# Patient Record
Sex: Male | Born: 1945 | Race: Black or African American | Hispanic: No | Marital: Single
Health system: Southern US, Community
[De-identification: ages and names within clinical notes are randomized; demographics above are authoritative.]

## PROBLEM LIST (undated history)

## (undated) DIAGNOSIS — I10 Essential (primary) hypertension: Secondary | ICD-10-CM

## (undated) DIAGNOSIS — E119 Type 2 diabetes mellitus without complications: Secondary | ICD-10-CM

## (undated) DIAGNOSIS — K219 Gastro-esophageal reflux disease without esophagitis: Secondary | ICD-10-CM

## (undated) DIAGNOSIS — K2210 Ulcer of esophagus without bleeding: Secondary | ICD-10-CM

## (undated) DIAGNOSIS — D509 Iron deficiency anemia, unspecified: Secondary | ICD-10-CM

## (undated) HISTORY — DX: Iron deficiency anemia, unspecified: D50.9

## (undated) HISTORY — PX: HERNIA REPAIR: SHX51

---

## 2009-12-22 ENCOUNTER — Emergency Department: Payer: Self-pay | Admitting: Emergency Medicine

## 2010-01-31 ENCOUNTER — Telehealth (INDEPENDENT_AMBULATORY_CARE_PROVIDER_SITE_OTHER): Payer: Self-pay | Admitting: Radiology

## 2010-02-01 ENCOUNTER — Encounter: Payer: Self-pay | Admitting: Internal Medicine

## 2010-02-01 ENCOUNTER — Ambulatory Visit: Payer: Self-pay | Admitting: Internal Medicine

## 2010-02-01 ENCOUNTER — Encounter (HOSPITAL_COMMUNITY)
Admission: RE | Admit: 2010-02-01 | Discharge: 2010-04-11 | Payer: Self-pay | Source: Home / Self Care | Attending: Internal Medicine | Admitting: Internal Medicine

## 2010-02-01 ENCOUNTER — Ambulatory Visit: Payer: Self-pay

## 2010-04-11 NOTE — Progress Notes (Signed)
Summary: nuc pre-procedure  Phone Note Outgoing Call   Call placed by: Domenic Polite, CNMT,  January 31, 2010 11:56 AM Call placed to: Patient Reason for Call: Confirm/change Appt Summary of Call: Tried to leave message several times, there was no answer. Initial call taken by: Domenic Polite, CNMT,  January 31, 2010 11:56 AM     Nuclear Med Background Indications for Stress Test: Evaluation for Ischemia     Symptoms: Chest Pain, SOB  Symptoms Comments: Indigestion   Nuclear Pre-Procedure Cardiac Risk Factors: Hypertension, Lipids, NIDDM

## 2010-04-11 NOTE — Assessment & Plan Note (Signed)
Summary: Cardiology Nuclear Testing  Nuclear Med Background Indications for Stress Test: Evaluation for Ischemia    History Comments: No documented CAD  Symptoms: Chest Pain  Symptoms Comments: Last episode of CP, "indigestion:one month ago.   Nuclear Pre-Procedure Cardiac Risk Factors: History of Smoking, Hypertension, Lipids, NIDDM, Obesity Caffeine/Decaff Intake: NONE NPO After: 7:30 AM Lungs: Clear.  O2 Sat 99% on RA. IV 0.9% NS with Angio Cath: 22g     IV Site: R Hand IV Started by: Doyne Keel, CNMT Chest Size (in) 46     Height (in): 73 Weight (lb): 248 BMI: 32.84  Nuclear Med Study 1 or 2 day study:  1 day     Stress Test Type:  Treadmill/Lexiscan Reading MD:  Arvilla Meres, MD     Referring MD:  Sherrie Mustache, MD Resting Radionuclide:  Technetium 64m Tetrofosmin     Resting Radionuclide Dose:  11 mCi  Stress Radionuclide:  Technetium 77m Tetrofosmin     Stress Radionuclide Dose:  33 mCi   Stress Protocol Exercise Time (min):  2:00 min     Max HR:  118 bpm     Predicted Max HR:  155 bpm  Max Systolic BP: 176 mm Hg     Percent Max HR:  76.13 %Rate Pressure Product:  16109  Lexiscan: 0.4 mg   Stress Test Technologist:  [employer], CMA-N     Nuclear Technologist:  Doyne Keel, CNMT  Rest Procedure  Myocardial perfusion imaging was performed at rest 45 minutes following the intravenous administration of Technetium 33m Tetrofosmin.  Stress Procedure  The patient received IV Lexiscan 0.4 mg over 15-seconds with concurrent low level exercise and then Technetium 72m Tetrofosmin was injected at 30-seconds.  There were no significant changes with infusion, only occasional PVC's and PAC's.  Quantitative spect images were obtained after a 45 minute delay.  QPS Raw Data Images:  Normal; no motion artifact; normal heart/lung ratio. 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  Transient Ischemic Dilatation:  0.28  (Normal <1.22)  Lung/Heart Ratio:  1.09  (Normal <0.45)  Quantitative Gated Spect Images QGS EDV:  141 ml QGS ESV:  61 ml QGS EF:  56 % QGS cine images:  Normal  Findings Abnormal nuclear study Evidence for inferior ischemia      Overall Impression  Exercise Capacity: Lexiscan with no exercise. ECG Impression: No significant ST segment change with Lexiscan. Overall Impression: Abnormal stress nuclear study. 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 (Cannot exclude diaphragmatic attenuation).  Appended Document: Cardiology Nuclear Testing copy faxed to dr. Marina Goodell

## 2013-05-10 DIAGNOSIS — K279 Peptic ulcer, site unspecified, unspecified as acute or chronic, without hemorrhage or perforation: Secondary | ICD-10-CM

## 2013-05-10 HISTORY — DX: Peptic ulcer, site unspecified, unspecified as acute or chronic, without hemorrhage or perforation: K27.9

## 2013-05-24 ENCOUNTER — Encounter (HOSPITAL_COMMUNITY): Payer: Self-pay | Admitting: Emergency Medicine

## 2013-05-24 ENCOUNTER — Emergency Department (HOSPITAL_COMMUNITY)
Admission: EM | Admit: 2013-05-24 | Discharge: 2013-05-24 | Discharge status: Against Medical Advice | Payer: Non-veteran care | Attending: Emergency Medicine | Admitting: Emergency Medicine

## 2013-05-24 ENCOUNTER — Observation Stay (HOSPITAL_COMMUNITY)
Admission: EM | Admit: 2013-05-24 | Discharge: 2013-05-26 | Discharge status: Against Medical Advice | Payer: Non-veteran care | Attending: Internal Medicine | Admitting: Internal Medicine

## 2013-05-24 ENCOUNTER — Emergency Department (HOSPITAL_COMMUNITY): Payer: Non-veteran care

## 2013-05-24 DIAGNOSIS — R0602 Shortness of breath: Secondary | ICD-10-CM | POA: Insufficient documentation

## 2013-05-24 DIAGNOSIS — R0989 Other specified symptoms and signs involving the circulatory and respiratory systems: Secondary | ICD-10-CM | POA: Insufficient documentation

## 2013-05-24 DIAGNOSIS — R079 Chest pain, unspecified: Secondary | ICD-10-CM | POA: Insufficient documentation

## 2013-05-24 DIAGNOSIS — E119 Type 2 diabetes mellitus without complications: Secondary | ICD-10-CM | POA: Diagnosis present

## 2013-05-24 DIAGNOSIS — I10 Essential (primary) hypertension: Secondary | ICD-10-CM | POA: Insufficient documentation

## 2013-05-24 DIAGNOSIS — R0609 Other forms of dyspnea: Secondary | ICD-10-CM | POA: Insufficient documentation

## 2013-05-24 DIAGNOSIS — R0600 Dyspnea, unspecified: Secondary | ICD-10-CM

## 2013-05-24 DIAGNOSIS — Z91199 Patient's noncompliance with other medical treatment and regimen due to unspecified reason: Secondary | ICD-10-CM | POA: Insufficient documentation

## 2013-05-24 DIAGNOSIS — Z8711 Personal history of peptic ulcer disease: Secondary | ICD-10-CM | POA: Insufficient documentation

## 2013-05-24 DIAGNOSIS — R5383 Other fatigue: Secondary | ICD-10-CM

## 2013-05-24 DIAGNOSIS — R110 Nausea: Secondary | ICD-10-CM | POA: Insufficient documentation

## 2013-05-24 DIAGNOSIS — K219 Gastro-esophageal reflux disease without esophagitis: Secondary | ICD-10-CM | POA: Insufficient documentation

## 2013-05-24 DIAGNOSIS — R5381 Other malaise: Secondary | ICD-10-CM | POA: Insufficient documentation

## 2013-05-24 DIAGNOSIS — R509 Fever, unspecified: Secondary | ICD-10-CM | POA: Insufficient documentation

## 2013-05-24 DIAGNOSIS — Z87891 Personal history of nicotine dependence: Secondary | ICD-10-CM | POA: Insufficient documentation

## 2013-05-24 DIAGNOSIS — Z23 Encounter for immunization: Secondary | ICD-10-CM | POA: Insufficient documentation

## 2013-05-24 DIAGNOSIS — R918 Other nonspecific abnormal finding of lung field: Secondary | ICD-10-CM | POA: Insufficient documentation

## 2013-05-24 DIAGNOSIS — E785 Hyperlipidemia, unspecified: Secondary | ICD-10-CM | POA: Diagnosis present

## 2013-05-24 DIAGNOSIS — Z9119 Patient's noncompliance with other medical treatment and regimen: Secondary | ICD-10-CM | POA: Insufficient documentation

## 2013-05-24 HISTORY — DX: Ulcer of esophagus without bleeding: K22.10

## 2013-05-24 HISTORY — DX: Gastro-esophageal reflux disease without esophagitis: K21.9

## 2013-05-24 HISTORY — DX: Type 2 diabetes mellitus without complications: E11.9

## 2013-05-24 HISTORY — DX: Essential (primary) hypertension: I10

## 2013-05-24 LAB — HEPATIC FUNCTION PANEL
ALT: 13 U/L (ref 0–53)
AST: 20 U/L (ref 0–37)
Albumin: 4.2 g/dL (ref 3.5–5.2)
Alkaline Phosphatase: 92 U/L (ref 39–117)
Total Bilirubin: 0.4 mg/dL (ref 0.3–1.2)
Total Protein: 8.5 g/dL — ABNORMAL HIGH (ref 6.0–8.3)

## 2013-05-24 LAB — CBC
HCT: 39.4 % (ref 39.0–52.0)
HEMOGLOBIN: 13.7 g/dL (ref 13.0–17.0)
MCH: 30 pg (ref 26.0–34.0)
MCHC: 34.8 g/dL (ref 30.0–36.0)
MCV: 86.4 fL (ref 78.0–100.0)
Platelets: 316 10*3/uL (ref 150–400)
RBC: 4.56 MIL/uL (ref 4.22–5.81)
RDW: 13.5 % (ref 11.5–15.5)
WBC: 10.4 10*3/uL (ref 4.0–10.5)

## 2013-05-24 LAB — BASIC METABOLIC PANEL
BUN: 16 mg/dL (ref 6–23)
CALCIUM: 9.8 mg/dL (ref 8.4–10.5)
CO2: 26 mEq/L (ref 19–32)
CREATININE: 1.11 mg/dL (ref 0.50–1.35)
Chloride: 92 mEq/L — ABNORMAL LOW (ref 96–112)
GFR calc Af Amer: 77 mL/min — ABNORMAL LOW (ref >90)
GFR, EST NON AFRICAN AMERICAN: 66 mL/min — AB (ref >90)
Glucose, Bld: 141 mg/dL — ABNORMAL HIGH (ref 70–99)
POTASSIUM: 4.4 meq/L (ref 3.7–5.3)
Sodium: 132 mEq/L — ABNORMAL LOW (ref 137–147)

## 2013-05-24 LAB — I-STAT TROPONIN, ED: Troponin i, poc: 0.01 ng/mL (ref 0.00–0.08)

## 2013-05-24 LAB — TROPONIN I: Troponin I: <0.3 ng/mL (ref <0.30)

## 2013-05-24 LAB — LIPASE, BLOOD: Lipase: 16 U/L (ref 11–59)

## 2013-05-24 LAB — D-DIMER, QUANTITATIVE: D-Dimer, Quant: 0.42 ug/mL-FEU (ref 0.00–0.48)

## 2013-05-24 LAB — PRO B NATRIURETIC PEPTIDE: PRO B NATRI PEPTIDE: 27.4 pg/mL (ref 0–125)

## 2013-05-24 MED ORDER — LISINOPRIL-HYDROCHLOROTHIAZIDE 20-12.5 MG PO TABS: 1.0000 | ORAL_TABLET | Freq: Every day | ORAL | Status: DC

## 2013-05-24 MED ORDER — MORPHINE SULFATE 4 MG/ML IJ SOLN
4.0000 mg | Freq: Once | INTRAMUSCULAR | Status: AC
  Administered 2013-05-24: 4 mg via INTRAVENOUS
  Filled 2013-05-24: qty 1

## 2013-05-24 MED ORDER — CARVEDILOL 6.25 MG PO TABS
6.2500 mg | ORAL_TABLET | Freq: Two times a day (BID) | ORAL | Status: DC
  Administered 2013-05-25 (×2): 6.25 mg via ORAL
  Filled 2013-05-24 (×4): qty 1

## 2013-05-24 MED ORDER — NITROGLYCERIN 0.4 MG SL SUBL
0.4000 mg | SUBLINGUAL_TABLET | SUBLINGUAL | Status: DC | PRN
  Administered 2013-05-24 (×2): 0.4 mg via SUBLINGUAL
  Filled 2013-05-24: qty 1

## 2013-05-24 MED ORDER — MORPHINE SULFATE 2 MG/ML IJ SOLN
2.0000 mg | INTRAMUSCULAR | Status: DC | PRN
Start: 2013-05-24 — End: 2013-05-26
  Administered 2013-05-25: 2 mg via INTRAVENOUS
  Filled 2013-05-24: qty 1

## 2013-05-24 MED ORDER — INSULIN ASPART 100 UNIT/ML ~~LOC~~ SOLN
0.0000 [IU] | Freq: Three times a day (TID) | SUBCUTANEOUS | Status: DC
  Administered 2013-05-25 (×3): 1 [IU] via SUBCUTANEOUS

## 2013-05-24 MED ORDER — SODIUM CHLORIDE 0.9 % IV SOLN
INTRAVENOUS | Status: DC
  Administered 2013-05-24: via INTRAVENOUS

## 2013-05-24 MED ORDER — ENOXAPARIN SODIUM 40 MG/0.4ML ~~LOC~~ SOLN
40.0000 mg | Freq: Every day | SUBCUTANEOUS | Status: DC
  Administered 2013-05-25: 40 mg via SUBCUTANEOUS
  Filled 2013-05-24 (×2): qty 0.4

## 2013-05-24 MED ORDER — ONDANSETRON HCL 4 MG/2ML IJ SOLN
4.0000 mg | Freq: Once | INTRAMUSCULAR | Status: DC
  Filled 2013-05-24: qty 2

## 2013-05-24 MED ORDER — BIOTENE DRY MOUTH MT LIQD
15.0000 mL | Freq: Two times a day (BID) | OROMUCOSAL | Status: DC
  Administered 2013-05-25 (×2): 15 mL via OROMUCOSAL

## 2013-05-24 MED ORDER — VITAMIN D3 25 MCG (1000 UNIT) PO TABS
2000.0000 [IU] | ORAL_TABLET | Freq: Every day | ORAL | Status: DC
  Administered 2013-05-25: 2000 [IU] via ORAL
  Filled 2013-05-24 (×2): qty 2

## 2013-05-24 MED ORDER — SODIUM CHLORIDE 0.9 % IV BOLUS (SEPSIS)
1000.0000 mL | Freq: Once | INTRAVENOUS | Status: AC
  Administered 2013-05-24: 1000 mL via INTRAVENOUS

## 2013-05-24 MED ORDER — NITROGLYCERIN 0.4 MG SL SUBL: 0.4000 mg | SUBLINGUAL_TABLET | SUBLINGUAL | Status: DC | PRN

## 2013-05-24 MED ORDER — SIMVASTATIN 20 MG PO TABS
20.0000 mg | ORAL_TABLET | Freq: Every day | ORAL | Status: DC
  Administered 2013-05-25 (×2): 20 mg via ORAL
  Filled 2013-05-24 (×4): qty 1

## 2013-05-24 MED ORDER — PANTOPRAZOLE SODIUM 40 MG PO TBEC
40.0000 mg | DELAYED_RELEASE_TABLET | Freq: Every day | ORAL | Status: DC
  Administered 2013-05-25: 40 mg via ORAL
  Filled 2013-05-24: qty 1

## 2013-05-24 MED ORDER — NITROGLYCERIN 0.4 MG/HR TD PT24
0.4000 mg | MEDICATED_PATCH | Freq: Every day | TRANSDERMAL | Status: DC
  Administered 2013-05-24 – 2013-05-25 (×2): 0.4 mg via TRANSDERMAL
  Filled 2013-05-24 (×2): qty 1

## 2013-05-24 MED ORDER — PNEUMOCOCCAL VAC POLYVALENT 25 MCG/0.5ML IJ INJ
0.5000 mL | INJECTION | INTRAMUSCULAR | Status: AC
  Administered 2013-05-25: 0.5 mL via INTRAMUSCULAR
  Filled 2013-05-24 (×2): qty 0.5

## 2013-05-24 MED ORDER — ASPIRIN 81 MG PO CHEW
324.0000 mg | CHEWABLE_TABLET | Freq: Once | ORAL | Status: AC
  Administered 2013-05-24: 324 mg via ORAL
  Filled 2013-05-24: qty 4

## 2013-05-24 MED ORDER — ASPIRIN EC 325 MG PO TBEC
325.0000 mg | DELAYED_RELEASE_TABLET | Freq: Every day | ORAL | Status: DC
  Administered 2013-05-25: 325 mg via ORAL
  Filled 2013-05-24 (×2): qty 1

## 2013-05-24 MED ORDER — LISINOPRIL 20 MG PO TABS
20.0000 mg | ORAL_TABLET | Freq: Every day | ORAL | Status: DC
  Administered 2013-05-25: 20 mg via ORAL
  Filled 2013-05-24: qty 1

## 2013-05-24 MED ORDER — MORPHINE SULFATE 4 MG/ML IJ SOLN
4.0000 mg | Freq: Once | INTRAMUSCULAR | Status: DC
  Filled 2013-05-24: qty 1

## 2013-05-24 MED ORDER — HYDROCHLOROTHIAZIDE 12.5 MG PO CAPS
12.5000 mg | ORAL_CAPSULE | Freq: Every day | ORAL | Status: DC
  Administered 2013-05-25: 12.5 mg via ORAL
  Filled 2013-05-24: qty 1

## 2013-05-24 NOTE — H&P (Addendum)
[facility] History and Physical  TZVI ECONOMOU ZOX:[id number] DOB: Jul 20, 1945 DOA: 05/24/2013  Referring physician: ER physician. PCP: No primary provider on file[facility] [place]  Chief Complaint: Chest pain.  HPI: Allen Wiggins is a 68 y.o. male history of diabetes mellitus type 2, hypertension, hyperlipidemia presents to the ER second time today with chest pain. Patient had chest pain since morning 3 AM while working. Patient's chest is retrosternal nonradiating pressure-like increased on exertion. He went home and had some water and since there was no relief he came to the ER. In the ER patient's cardiac markers were negative and chest pain improved with nitroglycerin sublingual and patient went home at the time and did not want to stay. After going chest pain recurred and came back to the ER. At this time patient's chest pain improved with morphine and sublingual nitroglycerin and has been admitted for further management. Patient says pain is retrosternal in this time is more like something gripping. In addition to the chest pain patient also has shortness of breath which makes patient difficult to lie down. Patient denies any fever chills cough but had diaphoresis in the morning and him initially. Chest x-ray shows atelectasis versus pneumonia and patient does not have any symptoms to suggest pneumonia. Patient otherwise denies any nausea vomiting abdominal pain diarrhea focal deficits headache.   Review of Systems: As presented in the history of presenting illness, rest negative.  Past Medical History  Diagnosis Date  . Diabetes mellitus without complication   . Hypertension   . GERD (gastroesophageal reflux disease)   . Peptic ulcer of esophagus    Past Surgical History  Procedure Laterality Date  . Hernia repair     Social History:  reports that he has quit smoking. He does not have any smokeless tobacco history on file. He reports that he drinks alcohol. His drug history  is not on file. Where does patient live home. Can patient participate in ADLs? Yes.  No Known Allergies  Family History:  Family History  Problem Relation Age of Onset  . Skin cancer Mother   . Diabetes Mellitus II Father   . Stroke Sister   . CAD Neg Hx       Prior to Admission medications   Medication Sig Start Date End Date Taking? Authorizing Provider  carvedilol (COREG) 12.5 MG tablet Take 6.25 mg by mouth 2 (two) times daily with a meal.   Yes Historical Provider, MD  cholecalciferol (VITAMIN D) 1000 UNITS tablet Take 2,000 Units by mouth daily.   Yes Historical Provider, MD  lisinopril-hydrochlorothiazide (PRINZIDE,ZESTORETIC) 20-12.5 MG per tablet Take 1 tablet by mouth daily.   Yes Historical Provider, MD  metFORMIN (GLUCOPHAGE) 500 MG tablet Take 500 mg by mouth 2 (two) times daily with a meal.   Yes Historical Provider, MD  nitroGLYCERIN (NITROSTAT) 0.4 MG SL tablet Place 1 tablet (0.4 mg total) under the tongue every 5 (five) minutes as needed for chest pain. 05/24/13  Yes Trixie Dredge, PA-C  pantoprazole (PROTONIX) 40 MG tablet Take 40 mg by mouth daily.   Yes Historical Provider, MD  pravastatin (PRAVACHOL) 40 MG tablet Take 40 mg by mouth at bedtime.   Yes Historical Provider, MD    Physical Exam: Filed Vitals:   05/24/13 2006 05/24/13 2015 05/24/13 2100 05/24/13 2144  BP: 111/70   116/79  Pulse:  73 69 69  Temp:    98.4 F (36.9 C)  TempSrc:    Oral  Resp:  15 18 20   SpO2:  97% 100% 100%     General:  Well-developed and nourished.  Eyes: Anicteric no pallor.  ENT: No discharge from the ears eyes nose mouth.  Neck: No mass felt.  Cardiovascular: S1-S2 heard.  Respiratory: No rhonchi or crepitations.  Abdomen: Soft nontender bowel sounds present.  Skin: No rash.  Musculoskeletal: No edema.  Psychiatric: Appears normal.  Neurologic: Alert awake oriented to time place and person. Moves all extremities.  Labs on Admission:  Basic Metabolic  Panel:  Recent Labs Lab 05/24/13 1036  NA 132*  K 4.4  CL 92*  CO2 26  GLUCOSE 141*  BUN 16  CREATININE 1.11  CALCIUM 9.8   Liver Function Tests:  Recent Labs Lab 05/24/13 1036  AST 20  ALT 13  ALKPHOS 92  BILITOT 0.4  PROT 8.5*  ALBUMIN 4.2    Recent Labs Lab 05/24/13 2015  LIPASE 16   No results found for this basename: AMMONIA,  in the last 168 hours CBC:  Recent Labs Lab 05/24/13 1036  WBC 10.4  HGB 13.7  HCT 39.4  MCV 86.4  PLT 316   Cardiac Enzymes:  Recent Labs Lab 05/24/13 2016  TROPONINI <0.30    BNP (last 3 results)  Recent Labs  05/24/13 1036  PROBNP 27.4   CBG: No results found for this basename: GLUCAP,  in the last 168 hours  Radiological Exams on Admission: Dg Chest Port 1 View  05/24/2013   CLINICAL DATA:  Chest pain and shortness of breath.  EXAM: PORTABLE CHEST - 1 VIEW  COMPARISON:  None.  FINDINGS: The heart size is exaggerated by low lung volumes and AP technique. Mild pulmonary vascular congestion is evident. Minimal left basilar airspace disease likely reflects atelectasis. Early infection is not excluded.  IMPRESSION: 1. Low lung volumes and mild pulmonary vascular congestion. 2. Left basilar airspace disease likely reflects atelectasis. Early infection is not excluded.   Electronically Signed   By: Gennette Pachris  Mattern M.D.   On: 05/24/2013 11:47    EKG: Independently reviewed. Normal sinus rhythm with T-wave changes in the anterior and inferior leads.  Assessment/Plan Principal Problem:   Chest pain Active Problems:   Diabetes mellitus   HTN (hypertension)   HLD (hyperlipidemia)   1. Chest pain - given patient's risk factors including diabetes hypertension hyperlipidemia and previous history of positive stress test in 2011 showing inferior wall ischemia patient has been admitted to rule out ACS and cycle cardiac markers and will be kept n.p.o. past 4 AM in anticipation of possible cardiac procedures. Check d-dimer and  lipase. Check 2-D echo. Consult cardiology in a.m. Aspirin. At this time place patient on nitroglycerin patch and as needed morphine. Continue Coreg and statins. 2. Diabetes mellitus type 2 - in anticipation of possible procedure I have held patient's metformin and place patient on sliding-scale coverage. 3. History of peptic ulcer disease and GERD - continue PPI. Patient states that previously he has had a procedure for gastric ulcer/esophagitis. 4. Hypertension - continue home medications. 5. Hyperlipidemia - continue statins.  I have reviewed patient's old charts and labs.  Code Status: Full code.  Family Communication: None.  Disposition Plan: Admit for observation.    Rishaan Gunner N. [facility] Pager [phone].  If 7PM-7AM, please contact night-coverage [email or web address] Password [email or web address] 05/24/2013, 9:49 PM

## 2013-05-24 NOTE — ED Provider Notes (Signed)
[facility] and reports he has been having chest pain off and on,but this morning about 5:30 am he started having severe chest tightness with SOB and feeling weak. His pain has been relieved in the ED with NTG x 2.. He states he has NTG but has never used it. He states the pain today was different and it was much worse.   Pt is alert and in NAD at this time (painfree), he is not in respiratory distress.   Pt is adamant that he does not want to be admitted. He understands the risks of MI and even death. [facility].   Medical screening examination/treatment/procedure(s) were conducted as a shared visit with non-physician practitioner(s) and myself.  I personally evaluated the patient during the encounter.   EKG Interpretation   Date/Time:  Sunday May 24 2013 10:30:27 EDT Ventricular Rate:  65 PR Interval:  188 QRS Duration: 92 QT Interval:  389 QTC Calculation: 404 R Axis:   105 Text Interpretation:  Sinus rhythm Right axis deviation Low voltage,  precordial leads Consider anterior infarct borderlin T wave abnormalities  No significant change since last tracing Confirmed by Jalyiah Shelley  MD-I, Inmer Nix  ([id number]) on 05/24/2013 12:21:37 PM        Devoria AlbeIva Parrie Rasco, MD, Armando GangFACEP   Ward GivensIva L Dade Rodin, MD 05/24/13 1640

## 2013-05-24 NOTE — ED Notes (Addendum)
Pt reports chest pain started this morning at 0530. Reports SOB, and "wants to belch but I can't." Patient A&Ox4 and sitting in chair. Pt refused to remain in bed. Safely transferred to chair with this nurse remaining with pt. Pt able to ambulate independently.

## 2013-05-24 NOTE — Discharge Instructions (Signed)
We have strongly advised that you stay and be admitted to the hospital.  If you change your mind at any time, please return immediately to the hospital.  If you have worsening pain or recurrent symptoms, please go to your nearest emergency department.  Please follow up with your primary care provider as soon as possible.      Chest Pain (Nonspecific) It is often hard to give a specific diagnosis for the cause of chest pain. There is always a chance that your pain could be related to something serious, such as a heart attack or a blood clot in the lungs. You need to follow up with your caregiver for further evaluation. CAUSES   Heartburn.  Pneumonia or bronchitis.  Anxiety or stress.  Inflammation around your heart (pericarditis) or lung (pleuritis or pleurisy).  A blood clot in the lung.  A collapsed lung (pneumothorax). It can develop suddenly on its own (spontaneous pneumothorax) or from injury (trauma) to the chest.  Shingles infection (herpes zoster virus). The chest wall is composed of bones, muscles, and cartilage. Any of these can be the source of the pain.  The bones can be bruised by injury.  The muscles or cartilage can be strained by coughing or overwork.  The cartilage can be affected by inflammation and become sore (costochondritis). DIAGNOSIS  Lab tests or other studies, such as X-rays, electrocardiography, stress testing, or cardiac imaging, may be needed to find the cause of your pain.  TREATMENT   Treatment depends on what may be causing your chest pain. Treatment may include:  Acid blockers for heartburn.  Anti-inflammatory medicine.  Pain medicine for inflammatory conditions.  Antibiotics if an infection is present.  You may be advised to change lifestyle habits. This includes stopping smoking and avoiding alcohol, caffeine, and chocolate.  You may be advised to keep your head raised (elevated) when sleeping. This reduces the chance of acid going  backward from your stomach into your esophagus.  Most of the time, nonspecific chest pain will improve within 2 to 3 days with rest and mild pain medicine. HOME CARE INSTRUCTIONS   If antibiotics were prescribed, take your antibiotics as directed. Finish them even if you start to feel better.  For the next few days, avoid physical activities that bring on chest pain. Continue physical activities as directed.  Do not smoke.  Avoid drinking alcohol.  Only take over-the-counter or prescription medicine for pain, discomfort, or fever as directed by your caregiver.  Follow your caregiver's suggestions for further testing if your chest pain does not go away.  Keep any follow-up appointments you made. If you do not go to an appointment, you could develop lasting (chronic) problems with pain. If there is any problem keeping an appointment, you must call to reschedule. SEEK MEDICAL CARE IF:   You think you are having problems from the medicine you are taking. Read your medicine instructions carefully.  Your chest pain does not go away, even after treatment.  You develop a rash with blisters on your chest. SEEK IMMEDIATE MEDICAL CARE IF:   You have increased chest pain or pain that spreads to your arm, neck, jaw, back, or abdomen.  You develop shortness of breath, an increasing cough, or you are coughing up blood.  You have severe back or abdominal pain, feel nauseous, or vomit.  You develop severe weakness, fainting, or chills.  You have a fever. THIS IS AN EMERGENCY. Do not wait to see if the pain will go away.  Get medical help at once. Call your local emergency services (911 in [place] Do not drive yourself to the hospital. MAKE SURE YOU:   Understand these instructions.  Will watch your condition.  Will get help right away if you are not doing well or get worse. Document Released: 12/06/2004 Document Revised: 05/21/2011 Document Reviewed: 10/02/2007 [facility] Patient Information  2014 [place], [place].

## 2013-05-24 NOTE — ED Notes (Signed)
He states he felt better after having been here earlier in the day after receiving s.l. Nitroglycerine.  He states he felt well enough to leave earlier, so he did so.  He is here now with c/o recurrence of his central chest area pain, along with shortness of breath and exertional dyspnea.  Currently his skin is normal, warm and dry and he is breathing normally.

## 2013-05-24 NOTE — ED Provider Notes (Signed)
CSN: [id number]     Arrival date & time 05/24/13  1191 History   First MD Initiated Contact with Patient 05/24/13 1015     Chief Complaint  Patient presents with  . Chest Pain     (Consider location/radiation/quality/duration/timing/severity/associated sxs/prior Treatment) The history is provided by the patient.   Pt with hx HTN, hyperlipidemia, DM, abnormal lexiscan showing inferior wall ischemia in 2011, p/w central chest pain, constant since 5:30am, described as squeezing and sharp and sore, associated SOB, nausea, and left arm pain/weakness.  Pain is somewhat improved with leaning forward.  He ate a honey bun and pie this morning prior to the pain beginning, thought it might be indigestion.  He drank wine and water at home for pain but it did not help.   No recent illness, no fevers.  Denies recent immobilization, hx cancer, personal or family hx blood clots.  PCP is Dr Marina Goodell in [facility].    Past Medical History  Diagnosis Date  . Diabetes mellitus without complication    History reviewed. No pertinent past surgical history. No family history on file. History  Substance Use Topics  . Smoking status: Former Games developer  . Smokeless tobacco: Not on file  . Alcohol Use: Yes     Comment: ocassional    Review of Systems  Constitutional: Negative for fever.  Respiratory: Positive for shortness of breath. Negative for cough.   Cardiovascular: Positive for chest pain.  Gastrointestinal: Negative for nausea, vomiting, abdominal pain and diarrhea.  Genitourinary: Negative for dysuria, urgency and frequency.  Neurological: Positive for weakness.  All other systems reviewed and are negative.      Allergies  Review of patient's allergies indicates no known allergies.  Home Medications  No current outpatient prescriptions on file. BP 130/79  Temp(Src) 98.7 F (37.1 C) (Oral)  Resp 22  SpO2 100% Physical Exam  Nursing note and vitals  reviewed. Constitutional: He appears well-developed and well-nourished. No distress.  Uncomfortable appearing.  Standing, leaning forward over bed rail when I came to room.    HENT:  Head: Normocephalic and atraumatic.  Neck: Neck supple.  Cardiovascular: Normal rate, regular rhythm and intact distal pulses.   Pulmonary/Chest: Effort normal and breath sounds normal. No respiratory distress. He has no wheezes. He has no rales. He exhibits tenderness.    Abdominal: Soft. He exhibits no distension and no mass. There is no tenderness. There is no rebound and no guarding.  Musculoskeletal: He exhibits no edema and no tenderness.  Neurological: He is alert. He exhibits normal muscle tone.  Skin: He is not diaphoretic.    ED Course  Procedures (including critical care time) Labs Review Labs Reviewed  BASIC METABOLIC PANEL - Abnormal; Notable for the following:    Sodium 132 (*)    Chloride 92 (*)    Glucose, Bld 141 (*)    GFR calc non Af Amer 66 (*)    GFR calc Af Amer 77 (*)    All other components within normal limits  HEPATIC FUNCTION PANEL - Abnormal; Notable for the following:    Total Protein 8.5 (*)    All other components within normal limits  CBC  PRO B NATRIURETIC PEPTIDE  I-STAT TROPOININ, ED   Imaging Review Dg Chest Port 1 View  05/24/2013   CLINICAL DATA:  Chest pain and shortness of breath.  EXAM: PORTABLE CHEST - 1 VIEW  COMPARISON:  None.  FINDINGS: The heart size is exaggerated by low lung volumes and  AP technique. Mild pulmonary vascular congestion is evident. Minimal left basilar airspace disease likely reflects atelectasis. Early infection is not excluded.  IMPRESSION: 1. Low lung volumes and mild pulmonary vascular congestion. 2. Left basilar airspace disease likely reflects atelectasis. Early infection is not excluded.   Electronically Signed   By: Gennette Pac M.D.   On: 05/24/2013 11:47     EKG Interpretation   Date/Time:  Sunday May 24 2013 10:30:27  EDT Ventricular Rate:  65 PR Interval:  188 QRS Duration: 92 QT Interval:  389 QTC Calculation: 404 R Axis:   105 Text Interpretation:  Sinus rhythm Right axis deviation Low voltage,  precordial leads Consider anterior infarct borderlin T wave abnormalities  No significant change since last tracing Confirmed by KNAPP  MD-I, IVA  ([id number]) on 05/24/2013 12:21:37 PM     10 :30 AM Dr Lynelle Doctor made aware of patient.    11:17 AM Patient reports some improvement in SOB with nitroglycerin, BP is steady, have asked nurse to continue with second dose and add morphine, closely monitor BP  12:12 PM Patient reports he is feeling better.  I have discussed results with him thus far and my concern that with his abnormal EKG and hx abnormal lexiscan and current presentation that he might be having an MI/ "heart attack."  Pt acknowledges that he was told about abnormal cardiac study in 2011.  I have informed patient that I am concerned about his symptoms this morning and that I am specifically concerned about his heart and that I would like to admit him to the hospital for observation to continue to monitor and test his heart.  I have explained that if he chooses to leave he may die or be permanently disabled.  He acknowledges this and yet is eager to leave.  He tells me that he understands he may be having a heart attack but prefers that his primary care provider take care of him for this and do further testing.  I have expressed my concern that it is Sunday and the timing of seeing his primary care provider is not ideal if he is truly having an MI, that it is something that needs to be addressed now.  He states that he has had pain like this before, though admits never this severe, and he refuses to stay.  He is alert, conscious, conversational, appears to fully grasp and acknowledges my concerns for his health.  I have asked if there is something we can do to make him more comfortable or any concerns we might address  for him that might make him more comfortable and willing to stay - he says no.    Filed Vitals:   05/24/13 1200  BP: 111/74  Pulse:   Temp:   Resp: 18     MDM   Final diagnoses:  Chest pain  Dyspnea    Pt with hx HTN, hyperlipidemia, DM p/w central CP with rads to left arm, associated SOB, nausea.  Concern for ACS.  Pt has T wave flattening laterally.  Pain improved with nitroglycerin.  First troponin is negative.  CXR shows left basilar airspace disease vs atelectasis.  I suspect this is atelectasis and not pneumonia as patient has not had a cough or fever.  If patient agreed to admission he could be monitored and further worked up as needed, especially for possible PE if abnormality remained and patient remained symptomatic.  He does not have known risk factors for PE so this is less of  a concern than cardiac causes for his symptoms.  However, patient declines admission.  He has been made aware of our concerns, was also seen by Dr Lynelle DoctorKnapp.  I encouraged him to the best of my abilities to stay but he is capable of making his own medical decisions, he is clinically sober, alert, conversant, and expresses understanding of my concerns.  See discussion above.  Pt left against medical advice.  I have encouraged him to return at any time if he changes his mind and to follow up as soon as possible with his primary care provider if this is the only route he will accept.  Pt given a prescription for nitroglycerin per Dr Delford FieldKnapp's recommendation.      Trixie Dredgemily Damarco Keysor, PA-C 05/24/13 1556

## 2013-05-24 NOTE — ED Notes (Signed)
Pt signed AMA form. Was explained to pt that EDP believes that he is at risk for MI and leaving could be fatal and not recommended. Pt verbalized that he understood, he "is not staying" and stated,"Everyone has to die sometime." Pt was instructed to follow up ASAP with PCP and to return to ED/Call EMS if he experienced any CP, SOB, N/V, dizziness, or any new s/sx that were unusual. Pt agreed. Pt was instructed on taking nitro and when to call EMS. Pt is A&O and in NAD, VSS upon d/c. Pt walked to d/c window with no difficulty.

## 2013-05-24 NOTE — ED Notes (Addendum)
Pt c/o chest pain since 0530 this morning. States he usually has indigestion and it goes away but this has not. States he feel like he has tightness in his esophagus as well. Also states he has a hard time taking a deep breathe, denies dizziness,  blurred vision. States his left arm feels weak

## 2013-05-24 NOTE — ED Provider Notes (Signed)
See prior note   Ward GivensIva L Vasco Chong, MD 05/24/13 [phone]

## 2013-05-24 NOTE — ED Notes (Signed)
Attempted to give pt another nitro while pain had not subsided. Pt refused and wanted to wait. Pain began to subside w/out second nitro.

## 2013-05-24 NOTE — ED Provider Notes (Signed)
CSN: [id number]     Arrival date & time 05/24/13  1851 History   First MD Initiated Contact with Patient 05/24/13 1914     Chief Complaint  Patient presents with  . Chest Pain     (Consider location/radiation/quality/duration/timing/severity/associated sxs/prior Treatment) HPI Comments: 68 year old male presents with chest pain since early this morning. He states at 5:30 AM (14 hours ago) he had severe shortness of breath and chest pain with exertion. The pain seems to be a little bit better at rest. It is also worse with lying flat and better with sitting up. Does not seem to be a pleuritic component. He received nitroglycerin x2 in the ER but as he felt better he wanted to leave. The ER team want to admit him and he left AMA. The patient went home and now felt worse so he returned to the ER to have more treatment. Of note he did have an abnormal stress test in 2011 per our records. At this time the pain is similar to when he first arrived earlier this morning.   Past Medical History  Diagnosis Date  . Diabetes mellitus without complication   . Hypertension   . GERD (gastroesophageal reflux disease)   . Peptic ulcer of esophagus    No past surgical history on file. No family history on file. History  Substance Use Topics  . Smoking status: Former Games developer  . Smokeless tobacco: Not on file  . Alcohol Use: Yes     Comment: ocassional    Review of Systems  Respiratory: Positive for shortness of breath.   Cardiovascular: Positive for chest pain. Negative for leg swelling.  Gastrointestinal: Negative for vomiting and abdominal pain.  All other systems reviewed and are negative.      Allergies  Review of patient's allergies indicates no known allergies.  Home Medications   Current Outpatient Rx  Name  Route  Sig  Dispense  Refill  . carvedilol (COREG) 12.5 MG tablet   Oral   Take 6.25 mg by mouth 2 (two) times daily with a meal.         . cholecalciferol (VITAMIN D) 1000  UNITS tablet   Oral   Take 2,000 Units by mouth daily.         Marland Kitchen lisinopril-hydrochlorothiazide (PRINZIDE,ZESTORETIC) 20-12.5 MG per tablet   Oral   Take 1 tablet by mouth daily.         . metFORMIN (GLUCOPHAGE) 500 MG tablet   Oral   Take 500 mg by mouth 2 (two) times daily with a meal.         . nitroGLYCERIN (NITROSTAT) 0.4 MG SL tablet   Sublingual   Place 1 tablet (0.4 mg total) under the tongue every 5 (five) minutes as needed for chest pain.   20 tablet   0   . pantoprazole (PROTONIX) 40 MG tablet   Oral   Take 40 mg by mouth daily.         . pravastatin (PRAVACHOL) 40 MG tablet   Oral   Take 40 mg by mouth at bedtime.          BP 127/78  Pulse 80  Temp(Src) 98.3 F (36.8 C) (Oral)  Resp 18  SpO2 98% Physical Exam  Nursing note and vitals reviewed. Constitutional: He is oriented to person, place, and time. He appears well-developed and well-nourished.  HENT:  Head: Normocephalic and atraumatic.  Right Ear: External ear normal.  Left Ear: External ear normal.  Nose: Nose normal.  Eyes: Right eye exhibits no discharge. Left eye exhibits no discharge.  Neck: Neck supple.  Cardiovascular: Normal rate, regular rhythm, normal heart sounds and intact distal pulses.   Pulmonary/Chest: Effort normal and breath sounds normal.  Abdominal: Soft. There is no tenderness.  Musculoskeletal: He exhibits no edema.  Neurological: He is alert and oriented to person, place, and time.  Skin: Skin is warm and dry.    ED Course  Procedures (including critical care time) Labs Review Labs Reviewed  TROPONIN I   Imaging Review Dg Chest Port 1 View  05/24/2013   CLINICAL DATA:  Chest pain and shortness of breath.  EXAM: PORTABLE CHEST - 1 VIEW  COMPARISON:  None.  FINDINGS: The heart size is exaggerated by low lung volumes and AP technique. Mild pulmonary vascular congestion is evident. Minimal left basilar airspace disease likely reflects atelectasis. Early infection  is not excluded.  IMPRESSION: 1. Low lung volumes and mild pulmonary vascular congestion. 2. Left basilar airspace disease likely reflects atelectasis. Early infection is not excluded.   Electronically Signed   By: Gennette Pachris  Mattern M.D.   On: 05/24/2013 11:47     EKG Interpretation   Date/Time:  Sunday May 24 2013 19:02:39 EDT Ventricular Rate:  76 PR Interval:  137 QRS Duration: 91 QT Interval:  372 QTC Calculation: 418 R Axis:   -45 Text Interpretation:  Sinus rhythm Left anterior fascicular block Low  voltage, precordial leads nonspecific T wave abnormalities Limb leads QRS  are all reversed, ?lead placement Reconfirmed by John Vasconcelos  MD, Sheyenne Konz  ([id number]) on 05/24/2013 7:39:53 PM      MDM   Final diagnoses:  Chest pain    Patient's pain improved with nitro and morphine. There is no pleuritic component to his pain, doubt PE. EKG shows some T wave abnormalities but also shows flipped leads in limb leads. Our leads appear correct here. Do not appear as Q waves, I favor the leads were placed differently than this morning. Will treat his pain and admit for further cardiac workup to hospitalist. Negative troponin.    Audree CamelScott T Rey Fors, MD 05/24/13 2130

## 2013-05-24 NOTE — ED Notes (Addendum)
Pt went from 10/10 to 3/10 with SL nitro. Pt refused morphine and zofran and requested to have another nitro. Pt BP an HR WNL to admin. Pt is A&O and in NAD. Lars MageI Knapp notified

## 2013-05-25 ENCOUNTER — Encounter (HOSPITAL_COMMUNITY): Admission: EM | Discharge status: Against Medical Advice | Payer: Self-pay | Source: Home / Self Care

## 2013-05-25 DIAGNOSIS — I2510 Atherosclerotic heart disease of native coronary artery without angina pectoris: Secondary | ICD-10-CM

## 2013-05-25 DIAGNOSIS — I517 Cardiomegaly: Secondary | ICD-10-CM

## 2013-05-25 HISTORY — PX: LEFT HEART CATHETERIZATION WITH CORONARY ANGIOGRAM: SHX5451

## 2013-05-25 LAB — GLUCOSE, CAPILLARY
GLUCOSE-CAPILLARY: 134 mg/dL — AB (ref 70–99)
Glucose-Capillary: 145 mg/dL — ABNORMAL HIGH (ref 70–99)
Glucose-Capillary: 123 mg/dL — ABNORMAL HIGH (ref 70–99)
Glucose-Capillary: 132 mg/dL — ABNORMAL HIGH (ref 70–99)

## 2013-05-25 LAB — CBC WITH DIFFERENTIAL/PLATELET
Basophils Absolute: 0 10*3/uL (ref 0.0–0.1)
Basophils Relative: 0 % (ref 0–1)
EOS PCT: 1 % (ref 0–5)
Eosinophils Absolute: 0.1 10*3/uL (ref 0.0–0.7)
HCT: 36.5 % — ABNORMAL LOW (ref 39.0–52.0)
HEMOGLOBIN: 12 g/dL — AB (ref 13.0–17.0)
LYMPHS ABS: 2.9 10*3/uL (ref 0.7–4.0)
Lymphocytes Relative: 37 % (ref 12–46)
MCH: 28.8 pg (ref 26.0–34.0)
MCHC: 32.9 g/dL (ref 30.0–36.0)
MCV: 87.5 fL (ref 78.0–100.0)
MONOS PCT: 10 % (ref 3–12)
Monocytes Absolute: 0.8 10*3/uL (ref 0.1–1.0)
Neutro Abs: 4.1 10*3/uL (ref 1.7–7.7)
Neutrophils Relative %: 52 % (ref 43–77)
Platelets: 271 10*3/uL (ref 150–400)
RBC: 4.17 MIL/uL — AB (ref 4.22–5.81)
RDW: 13.6 % (ref 11.5–15.5)
WBC: 7.8 10*3/uL (ref 4.0–10.5)

## 2013-05-25 LAB — CBC
HEMATOCRIT: 34.5 % — AB (ref 39.0–52.0)
Hemoglobin: 11.9 g/dL — ABNORMAL LOW (ref 13.0–17.0)
MCH: 30 pg (ref 26.0–34.0)
MCHC: 34.5 g/dL (ref 30.0–36.0)
MCV: 86.9 fL (ref 78.0–100.0)
Platelets: 253 10*3/uL (ref 150–400)
RBC: 3.97 MIL/uL — ABNORMAL LOW (ref 4.22–5.81)
RDW: 13.5 % (ref 11.5–15.5)
WBC: 8.9 10*3/uL (ref 4.0–10.5)

## 2013-05-25 LAB — COMPREHENSIVE METABOLIC PANEL
ALT: 9 U/L (ref 0–53)
AST: 15 U/L (ref 0–37)
Albumin: 3.4 g/dL — ABNORMAL LOW (ref 3.5–5.2)
Alkaline Phosphatase: 76 U/L (ref 39–117)
BILIRUBIN TOTAL: 0.9 mg/dL (ref 0.3–1.2)
BUN: 18 mg/dL (ref 6–23)
CALCIUM: 8.9 mg/dL (ref 8.4–10.5)
CHLORIDE: 97 meq/L (ref 96–112)
CO2: 25 meq/L (ref 19–32)
Creatinine, Ser: 1.15 mg/dL (ref 0.50–1.35)
GFR calc Af Amer: 74 mL/min — ABNORMAL LOW (ref >90)
GFR calc non Af Amer: 64 mL/min — ABNORMAL LOW (ref >90)
Glucose, Bld: 152 mg/dL — ABNORMAL HIGH (ref 70–99)
Potassium: 3.9 mEq/L (ref 3.7–5.3)
Sodium: 135 mEq/L — ABNORMAL LOW (ref 137–147)
Total Protein: 7.2 g/dL (ref 6.0–8.3)

## 2013-05-25 LAB — SURGICAL PCR SCREEN
MRSA, PCR: NEGATIVE
STAPHYLOCOCCUS AUREUS: NEGATIVE

## 2013-05-25 LAB — CREATININE, SERUM
CREATININE: 1.17 mg/dL (ref 0.50–1.35)
GFR, EST AFRICAN AMERICAN: 72 mL/min — AB (ref >90)
GFR, EST NON AFRICAN AMERICAN: 62 mL/min — AB (ref >90)

## 2013-05-25 LAB — TROPONIN I

## 2013-05-25 SURGERY — LEFT HEART CATHETERIZATION WITH CORONARY ANGIOGRAM
Anesthesia: LOCAL

## 2013-05-25 MED ORDER — SODIUM CHLORIDE 0.9 % IJ SOLN: 3.0000 mL | INTRAMUSCULAR | Status: DC | PRN

## 2013-05-25 MED ORDER — SODIUM CHLORIDE 0.9 % IV SOLN
1.0000 mL/kg/h | INTRAVENOUS | Status: AC
  Administered 2013-05-25: 1 mL/kg/h via INTRAVENOUS

## 2013-05-25 MED ORDER — SODIUM CHLORIDE 0.9 % IV SOLN: 250.0000 mL | INTRAVENOUS | Status: DC | PRN

## 2013-05-25 MED ORDER — VERAPAMIL HCL 2.5 MG/ML IV SOLN
INTRAVENOUS | Status: AC
  Filled 2013-05-25: qty 2

## 2013-05-25 MED ORDER — FENTANYL CITRATE 0.05 MG/ML IJ SOLN
INTRAMUSCULAR | Status: AC
  Filled 2013-05-25: qty 2

## 2013-05-25 MED ORDER — SODIUM CHLORIDE 0.9 % IJ SOLN: 3.0000 mL | Freq: Two times a day (BID) | INTRAMUSCULAR | Status: DC

## 2013-05-25 MED ORDER — SODIUM CHLORIDE 0.9 % IV SOLN: INTRAVENOUS | Status: DC

## 2013-05-25 MED ORDER — CARVEDILOL 3.125 MG PO TABS
3.1250 mg | ORAL_TABLET | Freq: Two times a day (BID) | ORAL | Status: DC
  Administered 2013-05-25: 20:00:00 3.125 mg via ORAL
  Filled 2013-05-25 (×3): qty 1

## 2013-05-25 MED ORDER — HEPARIN (PORCINE) IN NACL 2-0.9 UNIT/ML-% IJ SOLN
INTRAMUSCULAR | Status: AC
  Filled 2013-05-25: qty 1000

## 2013-05-25 MED ORDER — LIDOCAINE HCL (PF) 1 % IJ SOLN
INTRAMUSCULAR | Status: AC
  Filled 2013-05-25: qty 30

## 2013-05-25 MED ORDER — ASPIRIN 81 MG PO CHEW
81.0000 mg | CHEWABLE_TABLET | ORAL | Status: DC
  Filled 2013-05-25: qty 1

## 2013-05-25 MED ORDER — HEPARIN SODIUM (PORCINE) 1000 UNIT/ML IJ SOLN
INTRAMUSCULAR | Status: AC
  Filled 2013-05-25: qty 1

## 2013-05-25 MED ORDER — HEPARIN SODIUM (PORCINE) 5000 UNIT/ML IJ SOLN
5000.0000 [IU] | Freq: Three times a day (TID) | INTRAMUSCULAR | Status: DC
  Administered 2013-05-25: 21:00:00 5000 [IU] via SUBCUTANEOUS
  Filled 2013-05-25 (×5): qty 1

## 2013-05-25 MED ORDER — LIVING WELL WITH DIABETES BOOK
Freq: Once | Status: DC
  Filled 2013-05-25: qty 1

## 2013-05-25 MED ORDER — MIDAZOLAM HCL 2 MG/2ML IJ SOLN
INTRAMUSCULAR | Status: AC
  Filled 2013-05-25: qty 2

## 2013-05-25 NOTE — Care Management Note (Signed)
    Page 1 of 1   05/25/2013     3:21:22 PM   CARE MANAGEMENT NOTE 05/25/2013  Patient:  Allen Wiggins,Allen Wiggins   Account Number:  [id number]  Date Initiated:  05/25/2013  Documentation initiated by:  Lanier ClamMAHABIR,Crayton Savarese  Subjective/Objective Assessment:   67 Y/O M ADMITTED W/CHEST PAIN.     Action/Plan:   FROM HOME.   Anticipated DC Date:  05/26/2013   Anticipated DC Plan:  HOME/SELF CARE      DC Planning Services  CM consult      Choice offered to / List presented to:             Status of service:  In process, will continue to follow Medicare Important Message given?   (If response is "NO", the following Medicare IM given date fields will be blank) Date Medicare IM given:   Date Additional Medicare IM given:    Discharge Disposition:    Per UR Regulation:  Reviewed for med. necessity/level of care/duration of stay  If discussed at Long Length of Stay Meetings, dates discussed:    Comments:  05/25/13 Terria Deschepper RN,BSN NCM [phone] [facility].CARDIO FOLLOWING.AWAIT RESULTS.

## 2013-05-25 NOTE — CV Procedure (Signed)
    Cardiac Catheterization Procedure Note  Name: Allen Wiggins MRN: [id number] DOB: 02/06/1946  Procedure: Left Heart Cath, Selective Coronary Angiography, LV angiography  Indication: 68 yo BM with multiple cardiac risk factors and prior abnormal myoview in 2011 presents with symptoms of chest pain.   Procedural Details: The right wrist was prepped, draped, and anesthetized with 1% lidocaine. Using the modified Seldinger technique, a 5 French sheath was introduced into the right radial artery. 3 mg of verapamil was administered through the sheath, weight-based unfractionated heparin was administered intravenously. Standard Judkins catheters were used for selective coronary angiography and left ventriculography. Catheter exchanges were performed over an exchange length guidewire. There were no immediate procedural complications. A TR band was used for radial hemostasis at the completion of the procedure.  The patient was transferred to the post catheterization recovery area for further monitoring.  Procedural Findings: Hemodynamics: AO 87/62 mean 74 mm Hg LV 103/17 mm Hg  Coronary angiography: Coronary dominance: right  Left mainstem: Normal  Left anterior descending (LAD): Mild irregularities in the proximal and mid vessel. In the far distal vessel after the LAD wraps around the apex there is a focal 80-90% stenosis.  Left circumflex (LCx): mild irregularities less than 20%.  Right coronary artery (RCA): Normal.   Left ventriculography: Left ventricular systolic function is normal, LVEF is estimated at 55-65%, there is no significant mitral regurgitation   Final Conclusions:   1. 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 2. Normal LV function.   Recommendations: Recommend medical management and risk factor modification. Patient was relatively hypotensive during cardiac cath. Will hold lisinopril, HCTZ and nitrates for now.   Theron Aristaeter  [facility] Gallow[facility] SouthJordanMD,FACC 05/25/2013, 3:29 PM

## 2013-05-25 NOTE — Progress Notes (Signed)
[facility] PROGRESS NOTE  SHI BLANKENSHIP ION:[id number] DOB: 02-Jul-1945 DOA: 05/24/2013 PCP: No primary provider on file.  Assessment/Plan: Chest Pain -Has multiple risk factors and an abnormal myoview in 2011 with a reversible defect. -Have requested cardiology consult and decision has been made to proceed with cardiac cath.  HTN -Well controlled.  DM -Fair control. -Follow CBGs and adjust regimen as needed.  Hyperlipidemia -Continue statin. -Check FLP.  PUD -Continue PPI.  Code Status: Full Code Family Communication: Patient only  Disposition Plan: Home when ready   Consultants:  Cardiology   Antibiotics:  None   Subjective: CP has resolved. Has ruled out for ACS.  Objective: Filed Vitals:   05/24/13 2100 05/24/13 2144 05/24/13 2255 05/25/13 0457  BP:  116/79 118/84 116/57  Pulse: 69 69 68 60  Temp:  98.4 F (36.9 C) 97.9 F (36.6 C) 98.6 F (37 C)  TempSrc:  Oral Oral Oral  Resp: 18 20 20 16   Height:   6' 1.5" (1.867 m)   Weight:   119.7 kg (263 lb 14.3 oz)   SpO2: 100% 100% 100% 98%    Intake/Output Summary (Last 24 hours) at 05/25/13 1354 Last data filed at 05/25/13 0900  Gross per 24 hour  Intake 141.67 ml  Output    425 ml  Net -283.33 ml   Filed Weights   05/24/13 2255  Weight: 119.7 kg (263 lb 14.3 oz)    Exam:   General:  AA Ox3  Cardiovascular: RRR  Respiratory: CTA B  Abdomen: S/NT/ND/+BS  Extremities: no C/C/E/+pulses   Neurologic:  Intact, non-focal.  Data Reviewed: Basic Metabolic Panel:  Recent Labs Lab 05/24/13 1036 05/25/13 0310  NA 132* 135*  K 4.4 3.9  CL 92* 97  CO2 26 25  GLUCOSE 141* 152*  BUN 16 18  CREATININE 1.11 1.15  CALCIUM 9.8 8.9   Liver Function Tests:  Recent Labs Lab 05/24/13 1036 05/25/13 0310  AST 20 15  ALT 13 9  ALKPHOS 92 76  BILITOT 0.4 0.9  PROT 8.5* 7.2  ALBUMIN 4.2 3.4*    Recent Labs Lab 05/24/13 2015  LIPASE 16   No results found for this basename:  AMMONIA,  in the last 168 hours CBC:  Recent Labs Lab 05/24/13 1036 05/25/13 0310  WBC 10.4 7.8  NEUTROABS  --  4.1  HGB 13.7 12.0*  HCT 39.4 36.5*  MCV 86.4 87.5  PLT 316 271   Cardiac Enzymes:  Recent Labs Lab 05/24/13 2016 05/25/13 0310 05/25/13 0930  TROPONINI <0.30 <0.30 <0.30   BNP (last 3 results)  Recent Labs  05/24/13 1036  PROBNP 27.4   CBG:  Recent Labs Lab 05/25/13 0725 05/25/13 1158  GLUCAP 134* 145*    No results found for this or any previous visit (from the past 240 hour(s)).   Studies: Dg Chest Port 1 View  05/24/2013   CLINICAL DATA:  Chest pain and shortness of breath.  EXAM: PORTABLE CHEST - 1 VIEW  COMPARISON:  None.  FINDINGS: The heart size is exaggerated by low lung volumes and AP technique. Mild pulmonary vascular congestion is evident. Minimal left basilar airspace disease likely reflects atelectasis. Early infection is not excluded.  IMPRESSION: 1. Low lung volumes and mild pulmonary vascular congestion. 2. Left basilar airspace disease likely reflects atelectasis. Early infection is not excluded.   Electronically Signed   By: Gennette Pac M.D.   On: 05/24/2013 11:47    Scheduled Meds: . antiseptic oral rinse  15 mL Mouth Rinse BID  . [START ON 05/26/2013] aspirin  81 mg Oral Pre-Cath  . aspirin EC  325 mg Oral Daily  . carvedilol  6.25 mg Oral BID WC  . cholecalciferol  2,000 Units Oral Daily  . enoxaparin (LOVENOX) injection  40 mg Subcutaneous QHS  . lisinopril  20 mg Oral Daily   And  . hydrochlorothiazide  12.5 mg Oral Daily  . insulin aspart  0-9 Units Subcutaneous TID WC  . nitroGLYCERIN  0.4 mg Transdermal Daily  . pantoprazole  40 mg Oral Daily  . simvastatin  20 mg Oral q1800  . sodium chloride  3 mL Intravenous Q12H   Continuous Infusions: . sodium chloride 20 mL/hr at 05/24/13 2345  . sodium chloride 125 mL/hr at 05/25/13 1245    Principal Problem:   Chest pain Active Problems:   Diabetes mellitus   HTN  (hypertension)   HLD (hyperlipidemia)    Time spent: 35 minutes. Greater than 50% of this time was spent in direct contact with the patient coordinating care.    Chaya JanHERNANDEZ ACOSTA,ESTELA  [facility] Pager [phone]  If 7PM-7AM, please contact night-coverage at [email or web address], password [email or web address] 05/25/2013, 1:54 PM  LOS: 1 day

## 2013-05-25 NOTE — Consult Note (Signed)
CONSULT NOTE  Date: 05/25/2013               Patient Name:  Allen Wiggins MRN: [id number]  DOB: 08/04/1945 Age / Sex: 68 y.o., male        PCP: No primary provider on file. Primary Cardiologist: Bensimhon  / new to [facility]             Referring Physician: Nhi Butrum Aspenhernandez Acosta              Reason for Consult: Chest pain          History of Present Illness: Patient is a 68 y.o. male with a PMHx of hypertension, hyperlipidemia, type 2 diabetes mellitus , who [facility] for evaluation of discomfort  He apparently was seen twice yesterday in the emergency room for episodes of chest pain. He originally presented with chest discomfort.    He left AGAINST MEDICAL ADVICE and represented with CP.   His troponin levels have remained negative.   Has history of cigarette smoking but states that he quit smoking now. His troponin levels are negative. His EKG is nonacute.  Ascites exertional chest pains in the past. This particular pain is worse than his other pains. He admits to eating a honey bun and 2 pieces of Pie  prior to his chest pain and wonders if this could be indigestion.  He had a stress Myoview study in 2011 which showed a reversible inferior wall defect. I cannot find any followup to this stress test.  Medications: Outpatient medications: Prescriptions prior to admission  Medication Sig Dispense Refill  . carvedilol (COREG) 12.5 MG tablet Take 6.25 mg by mouth 2 (two) times daily with a meal.      . cholecalciferol (VITAMIN D) 1000 UNITS tablet Take 2,000 Units by mouth daily.      Marland Kitchen. lisinopril-hydrochlorothiazide (PRINZIDE,ZESTORETIC) 20-12.5 MG per tablet Take 1 tablet by mouth daily.      . metFORMIN (GLUCOPHAGE) 500 MG tablet Take 500 mg by mouth 2 (two) times daily with a meal.      . nitroGLYCERIN (NITROSTAT) 0.4 MG SL tablet Place 1 tablet (0.4 mg total) under the tongue every 5 (five) minutes as needed for chest pain.  20 tablet  0  .  pantoprazole (PROTONIX) 40 MG tablet Take 40 mg by mouth daily.      . pravastatin (PRAVACHOL) 40 MG tablet Take 40 mg by mouth at bedtime.        Current medications: Current Facility-Administered Medications  Medication Dose Route Frequency Provider Last Rate Last Dose  . 0.9 %  sodium chloride infusion   Intravenous Continuous Eduard ClosArshad N Kakrakandy, MD 20 mL/hr at 05/24/13 2345    . antiseptic oral rinse (BIOTENE) solution 15 mL  15 mL Mouth Rinse BID Eduard ClosArshad N Kakrakandy, MD   15 mL at 05/25/13 0800  . aspirin EC tablet 325 mg  325 mg Oral Daily Eduard ClosArshad N Kakrakandy, MD   325 mg at 05/25/13 1009  . carvedilol (COREG) tablet 6.25 mg  6.25 mg Oral BID WC Eduard ClosArshad N Kakrakandy, MD   6.25 mg at 05/25/13 1008  . cholecalciferol (VITAMIN D) tablet 2,000 Units  2,000 Units Oral Daily Eduard ClosArshad N Kakrakandy, MD   2,000 Units at 05/25/13 1009  . enoxaparin (LOVENOX) injection 40 mg  40 mg Subcutaneous QHS Eduard ClosArshad N Kakrakandy, MD   40 mg at 05/25/13 0048  . lisinopril (PRINIVIL,ZESTRIL) tablet 20 mg  20  mg Oral Daily Eduard Clos, MD   20 mg at 05/25/13 1009   And  . hydrochlorothiazide (MICROZIDE) capsule 12.5 mg  12.5 mg Oral Daily Eduard Clos, MD   12.5 mg at 05/25/13 1009  . insulin aspart (novoLOG) injection 0-9 Units  0-9 Units Subcutaneous TID WC Eduard Clos, MD   1 Units at 05/25/13 1009  . morphine 2 MG/ML injection 2 mg  2 mg Intravenous Q2H PRN Eduard Clos, MD   2 mg at 05/25/13 0043  . nitroGLYCERIN (NITRODUR - Dosed in mg/24 hr) patch 0.4 mg  0.4 mg Transdermal Daily Eduard Clos, MD   0.4 mg at 05/25/13 1011  . pantoprazole (PROTONIX) EC tablet 40 mg  40 mg Oral Daily Eduard Clos, MD   40 mg at 05/25/13 1009  . simvastatin (ZOCOR) tablet 20 mg  20 mg Oral q1800 Eduard Clos, MD   20 mg at 05/25/13 0048     No Known Allergies   Past Medical History  Diagnosis Date  . Diabetes mellitus without complication   . Hypertension   . GERD  (gastroesophageal reflux disease)   . Peptic ulcer of esophagus     Past Surgical History  Procedure Laterality Date  . Hernia repair      Family History  Problem Relation Age of Onset  . Skin cancer Mother   . Diabetes Mellitus II Father   . Stroke Sister   . CAD Neg Hx     Social History:  reports that he has quit smoking. His smoking use included Cigarettes. He smoked 0.00 packs per day. He does not have any smokeless tobacco history on file. He reports that he drinks alcohol. His drug history is not on file.   Review of Systems: Constitutional:  denies fever, chills, diaphoresis, appetite change and fatigue.  HEENT: denies photophobia, eye pain, redness, hearing loss, ear pain, congestion, sore throat, rhinorrhea, sneezing, neck pain, neck stiffness and tinnitus.  Respiratory: admits to SOB, DOE,  chest tightness,   Cardiovascular: admits to chest pain, palpitations and leg swelling.  Gastrointestinal: denies nausea, vomiting, abdominal pain, diarrhea, constipation, blood in stool.  Genitourinary: denies dysuria, urgency, frequency, hematuria, flank pain and difficulty urinating.  Musculoskeletal: denies  myalgias, back pain, joint swelling, arthralgias and gait problem.   Skin: denies pallor, rash and wound.  Neurological: denies dizziness, seizures, syncope, weakness, light-headedness, numbness and headaches.   Hematological: denies adenopathy, easy bruising, personal or family bleeding history.  Psychiatric/ Behavioral: denies suicidal ideation, mood changes, confusion, nervousness, sleep disturbance and agitation.    Physical Exam: BP 116/57  Pulse 60  Temp(Src) 98.6 F (37 C) (Oral)  Resp 16  Ht 6' 1.5" (1.867 m)  Wt 263 lb 14.3 oz (119.7 kg)  BMI 34.34 kg/m2  SpO2 98%  Wt Readings from Last 3 Encounters:  05/24/13 263 lb 14.3 oz (119.7 kg)  02/01/10 248 lb (112.492 kg)    General: Vital signs reviewed and noted. Well-developed, well-nourished, in no acute  distress; alert,   Head: Normocephalic, atraumatic, sclera anicteric,   Neck: Supple. Negative for carotid bruits. No JVD   Lungs:  Clear bilaterally, no  wheezes, rales, or rhonchi. Breathing is normal   Heart: RRR with S1 S2. No murmurs, rubs, or gallops   Abdomen:  Soft, non-tender, non-distended with normoactive bowel sounds. No hepatomegaly. No rebound/guarding. No obvious abdominal masses   MSK: Strength and the appear normal for age.   Extremities: No clubbing or  cyanosis. No edema.  Distal pedal pulses are 2+ and equal   Neurologic: Alert and oriented X 3. Moves all extremities spontaneously.  Psych: Responds to questions appropriately with a normal affect.     Lab results: Basic Metabolic Panel:  Recent Labs Lab 05/24/13 1036 05/25/13 0310  NA 132* 135*  K 4.4 3.9  CL 92* 97  CO2 26 25  GLUCOSE 141* 152*  BUN 16 18  CREATININE 1.11 1.15  CALCIUM 9.8 8.9    Liver Function Tests:  Recent Labs Lab 05/24/13 1036 05/25/13 0310  AST 20 15  ALT 13 9  ALKPHOS 92 76  BILITOT 0.4 0.9  PROT 8.5* 7.2  ALBUMIN 4.2 3.4*    Recent Labs Lab 05/24/13 2015  LIPASE 16   No results found for this basename: AMMONIA,  in the last 168 hours  CBC:  Recent Labs Lab 05/24/13 1036 05/25/13 0310  WBC 10.4 7.8  NEUTROABS  --  4.1  HGB 13.7 12.0*  HCT 39.4 36.5*  MCV 86.4 87.5  PLT 316 271    Cardiac Enzymes:  Recent Labs Lab 05/24/13 2016 05/25/13 0310 05/25/13 0930  TROPONINI <0.30 <0.30 <0.30    BNP: No components found with this basename: POCBNP,   CBG:  Recent Labs Lab 05/25/13 0725 05/25/13 1158  GLUCAP 134* 145*    Coagulation Studies: No results found for this basename: LABPROT, INR,  in the last 72 hours   Other results:  EKG normal sinus rhythm. He has no ST or T wave changes.   Imaging: Dg Chest Port 1 View  05/24/2013   CLINICAL DATA:  Chest pain and shortness of breath.  EXAM: PORTABLE CHEST - 1 VIEW  COMPARISON:  None.   FINDINGS: The heart size is exaggerated by low lung volumes and AP technique. Mild pulmonary vascular congestion is evident. Minimal left basilar airspace disease likely reflects atelectasis. Early infection is not excluded.  IMPRESSION: 1. Low lung volumes and mild pulmonary vascular congestion. 2. Left basilar airspace disease likely reflects atelectasis. Early infection is not excluded.   Electronically Signed   By: Gennette Pac M.D.   On: 05/24/2013 11:47     He had an echo performed on March 15, has normal left ventricular systolic function the ejection fraction is 60-65%. The left atrium is mildly enlarged. There is moderate left ventricle hypertrophy.  He had a stress myoview in 2011 which was mildly abnormal.  I cannot find any follow p.  Overall Impression  Exercise Capacity: Lexiscan with no exercise.  ECG Impression: No significant ST segment change with Lexiscan.  Overall Impression: Abnormal stress nuclear study.  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 (Cannot exclude diaphragmatic attenuation).      Assessment & Plan:  1. Chest pain: The patient's episodes of chest pain are somewhat worrisome. He has multiple risk factors including diabetes mellitus, hypertension, hyperlipidemia. He admits to having these episodes of chest pain primarily with exertion but he also states that the pain gets worse with a deep breath.  He's had an abnormal Myoview study in the past. I think our best option is to proceed with cardiac catheterization given his history of an abnormal Myoview study and his chest discomfort.  We have discussed the risks, benefits, and options of cardiac cath He understands and agrees to proceed.      Vesta Mixer, Montez Hageman., MD, [facility] 05/25/2013, 12:09 PM  Office - [phone] Pager [phone]

## 2013-05-25 NOTE — Interval H&P Note (Signed)
History and Physical Interval Note:  05/25/2013 2:59 PM  Allen Wiggins  has presented today for surgery, with the diagnosis of Abnormal Stress Test  The various methods of treatment have been discussed with the patient and family. After consideration of risks, benefits and other options for treatment, the patient has consented to  Procedure(s): LEFT HEART CATHETERIZATION WITH CORONARY ANGIOGRAM (N/A) as a surgical intervention .  The patient's history has been reviewed, patient examined, no change in status, stable for surgery.  I have reviewed the patient's chart and labs.  Questions were answered to the patient's satisfaction.   Cath Lab Visit (complete for each Cath Lab visit)  Clinical Evaluation Leading to the Procedure:   ACS: yes  Non-ACS:    Anginal Classification: CCS III  Anti-ischemic medical therapy: Maximal Therapy (2 or more classes of medications)  Non-Invasive Test Results: No non-invasive testing performed  Prior CABG: No previous CABG        Theron Aristaeter [facility]MD,FACC 05/25/2013 2:59 PM

## 2013-05-25 NOTE — Progress Notes (Signed)
TR BAND REMOVAL   LOCATION:    right radial  DEFLATED PER PROTOCOL:    yes  TIME BAND OFF / DRESSING APPLIED:    1730   SITE UPON ARRIVAL:    Level 0  SITE AFTER BAND REMOVAL:    Level 0  REVERSE ALLEN'S TEST:     positive  CIRCULATION SENSATION AND MOVEMENT:    Within Normal Limits   yes  COMMENTS:   Gauze dressing applied. Rechecked at 1800 without change in assessment, CSMs wnls, dressing remains dry and intact.

## 2013-05-25 NOTE — H&P (View-Only) (Signed)
CONSULT NOTE  Date: 05/25/2013               Patient Name:  Allen Wiggins MRN: [id number]  DOB: 08/04/1945 Age / Sex: 68 y.o., male        PCP: No primary provider on file. Primary Cardiologist: Bensimhon  / new to [facility]             Referring Physician: Edye Hainline Aspenhernandez Acosta              Reason for Consult: Chest pain          History of Present Illness: Patient is a 68 y.o. male with a PMHx of hypertension, hyperlipidemia, type 2 diabetes mellitus , who was [facility] for evaluation of discomfort  He apparently was seen twice yesterday in the emergency room for episodes of chest pain. He originally presented with chest discomfort.    He left AGAINST MEDICAL ADVICE and represented with CP.   His troponin levels have remained negative.   Has history of cigarette smoking but states that he quit smoking now. His troponin levels are negative. His EKG is nonacute.  Ascites exertional chest pains in the past. This particular pain is worse than his other pains. He admits to eating a honey bun and 2 pieces of Pie  prior to his chest pain and wonders if this could be indigestion.  He had a stress Myoview study in 2011 which showed a reversible inferior wall defect. I cannot find any followup to this stress test.  Medications: Outpatient medications: Prescriptions prior to admission  Medication Sig Dispense Refill  . carvedilol (COREG) 12.5 MG tablet Take 6.25 mg by mouth 2 (two) times daily with a meal.      . cholecalciferol (VITAMIN D) 1000 UNITS tablet Take 2,000 Units by mouth daily.      Marland Kitchen. lisinopril-hydrochlorothiazide (PRINZIDE,ZESTORETIC) 20-12.5 MG per tablet Take 1 tablet by mouth daily.      . metFORMIN (GLUCOPHAGE) 500 MG tablet Take 500 mg by mouth 2 (two) times daily with a meal.      . nitroGLYCERIN (NITROSTAT) 0.4 MG SL tablet Place 1 tablet (0.4 mg total) under the tongue every 5 (five) minutes as needed for chest pain.  20 tablet  0  .  pantoprazole (PROTONIX) 40 MG tablet Take 40 mg by mouth daily.      . pravastatin (PRAVACHOL) 40 MG tablet Take 40 mg by mouth at bedtime.        Current medications: Current Facility-Administered Medications  Medication Dose Route Frequency Provider Last Rate Last Dose  . 0.9 %  sodium chloride infusion   Intravenous Continuous Eduard ClosArshad N Kakrakandy, MD 20 mL/hr at 05/24/13 2345    . antiseptic oral rinse (BIOTENE) solution 15 mL  15 mL Mouth Rinse BID Eduard ClosArshad N Kakrakandy, MD   15 mL at 05/25/13 0800  . aspirin EC tablet 325 mg  325 mg Oral Daily Eduard ClosArshad N Kakrakandy, MD   325 mg at 05/25/13 1009  . carvedilol (COREG) tablet 6.25 mg  6.25 mg Oral BID WC Eduard ClosArshad N Kakrakandy, MD   6.25 mg at 05/25/13 1008  . cholecalciferol (VITAMIN D) tablet 2,000 Units  2,000 Units Oral Daily Eduard ClosArshad N Kakrakandy, MD   2,000 Units at 05/25/13 1009  . enoxaparin (LOVENOX) injection 40 mg  40 mg Subcutaneous QHS Eduard ClosArshad N Kakrakandy, MD   40 mg at 05/25/13 0048  . lisinopril (PRINIVIL,ZESTRIL) tablet 20 mg  20  mg Oral Daily Eduard Clos, MD   20 mg at 05/25/13 1009   And  . hydrochlorothiazide (MICROZIDE) capsule 12.5 mg  12.5 mg Oral Daily Eduard Clos, MD   12.5 mg at 05/25/13 1009  . insulin aspart (novoLOG) injection 0-9 Units  0-9 Units Subcutaneous TID WC Eduard Clos, MD   1 Units at 05/25/13 1009  . morphine 2 MG/ML injection 2 mg  2 mg Intravenous Q2H PRN Eduard Clos, MD   2 mg at 05/25/13 0043  . nitroGLYCERIN (NITRODUR - Dosed in mg/24 hr) patch 0.4 mg  0.4 mg Transdermal Daily Eduard Clos, MD   0.4 mg at 05/25/13 1011  . pantoprazole (PROTONIX) EC tablet 40 mg  40 mg Oral Daily Eduard Clos, MD   40 mg at 05/25/13 1009  . simvastatin (ZOCOR) tablet 20 mg  20 mg Oral q1800 Eduard Clos, MD   20 mg at 05/25/13 0048     No Known Allergies   Past Medical History  Diagnosis Date  . Diabetes mellitus without complication   . Hypertension   . GERD  (gastroesophageal reflux disease)   . Peptic ulcer of esophagus     Past Surgical History  Procedure Laterality Date  . Hernia repair      Family History  Problem Relation Age of Onset  . Skin cancer Mother   . Diabetes Mellitus II Father   . Stroke Sister   . CAD Neg Hx     Social History:  reports that he has quit smoking. His smoking use included Cigarettes. He smoked 0.00 packs per day. He does not have any smokeless tobacco history on file. He reports that he drinks alcohol. His drug history is not on file.   Review of Systems: Constitutional:  denies fever, chills, diaphoresis, appetite change and fatigue.  HEENT: denies photophobia, eye pain, redness, hearing loss, ear pain, congestion, sore throat, rhinorrhea, sneezing, neck pain, neck stiffness and tinnitus.  Respiratory: admits to SOB, DOE,  chest tightness,   Cardiovascular: admits to chest pain, palpitations and leg swelling.  Gastrointestinal: denies nausea, vomiting, abdominal pain, diarrhea, constipation, blood in stool.  Genitourinary: denies dysuria, urgency, frequency, hematuria, flank pain and difficulty urinating.  Musculoskeletal: denies  myalgias, back pain, joint swelling, arthralgias and gait problem.   Skin: denies pallor, rash and wound.  Neurological: denies dizziness, seizures, syncope, weakness, light-headedness, numbness and headaches.   Hematological: denies adenopathy, easy bruising, personal or family bleeding history.  Psychiatric/ Behavioral: denies suicidal ideation, mood changes, confusion, nervousness, sleep disturbance and agitation.    Physical Exam: BP 116/57  Pulse 60  Temp(Src) 98.6 F (37 C) (Oral)  Resp 16  Ht 6' 1.5" (1.867 m)  Wt 263 lb 14.3 oz (119.7 kg)  BMI 34.34 kg/m2  SpO2 98%  Wt Readings from Last 3 Encounters:  05/24/13 263 lb 14.3 oz (119.7 kg)  02/01/10 248 lb (112.492 kg)    General: Vital signs reviewed and noted. Well-developed, well-nourished, in no acute  distress; alert,   Head: Normocephalic, atraumatic, sclera anicteric,   Neck: Supple. Negative for carotid bruits. No JVD   Lungs:  Clear bilaterally, no  wheezes, rales, or rhonchi. Breathing is normal   Heart: RRR with S1 S2. No murmurs, rubs, or gallops   Abdomen:  Soft, non-tender, non-distended with normoactive bowel sounds. No hepatomegaly. No rebound/guarding. No obvious abdominal masses   MSK: Strength and the appear normal for age.   Extremities: No clubbing or  cyanosis. No edema.  Distal pedal pulses are 2+ and equal   Neurologic: Alert and oriented X 3. Moves all extremities spontaneously.  Psych: Responds to questions appropriately with a normal affect.     Lab results: Basic Metabolic Panel:  Recent Labs Lab 05/24/13 1036 05/25/13 0310  NA 132* 135*  K 4.4 3.9  CL 92* 97  CO2 26 25  GLUCOSE 141* 152*  BUN 16 18  CREATININE 1.11 1.15  CALCIUM 9.8 8.9    Liver Function Tests:  Recent Labs Lab 05/24/13 1036 05/25/13 0310  AST 20 15  ALT 13 9  ALKPHOS 92 76  BILITOT 0.4 0.9  PROT 8.5* 7.2  ALBUMIN 4.2 3.4*    Recent Labs Lab 05/24/13 2015  LIPASE 16   No results found for this basename: AMMONIA,  in the last 168 hours  CBC:  Recent Labs Lab 05/24/13 1036 05/25/13 0310  WBC 10.4 7.8  NEUTROABS  --  4.1  HGB 13.7 12.0*  HCT 39.4 36.5*  MCV 86.4 87.5  PLT 316 271    Cardiac Enzymes:  Recent Labs Lab 05/24/13 2016 05/25/13 0310 05/25/13 0930  TROPONINI <0.30 <0.30 <0.30    BNP: No components found with this basename: POCBNP,   CBG:  Recent Labs Lab 05/25/13 0725 05/25/13 1158  GLUCAP 134* 145*    Coagulation Studies: No results found for this basename: LABPROT, INR,  in the last 72 hours   Other results:  EKG normal sinus rhythm. He has no ST or T wave changes.   Imaging: Dg Chest Port 1 View  05/24/2013   CLINICAL DATA:  Chest pain and shortness of breath.  EXAM: PORTABLE CHEST - 1 VIEW  COMPARISON:  None.   FINDINGS: The heart size is exaggerated by low lung volumes and AP technique. Mild pulmonary vascular congestion is evident. Minimal left basilar airspace disease likely reflects atelectasis. Early infection is not excluded.  IMPRESSION: 1. Low lung volumes and mild pulmonary vascular congestion. 2. Left basilar airspace disease likely reflects atelectasis. Early infection is not excluded.   Electronically Signed   By: Gennette Pac M.D.   On: 05/24/2013 11:47     He had an echo performed on March 15, has normal left ventricular systolic function the ejection fraction is 60-65%. The left atrium is mildly enlarged. There is moderate left ventricle hypertrophy.  He had a stress myoview in 2011 which was mildly abnormal.  I cannot find any follow p.  Overall Impression  Exercise Capacity: Lexiscan with no exercise.  ECG Impression: No significant ST segment change with Lexiscan.  Overall Impression: Abnormal stress nuclear study.  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 (Cannot exclude diaphragmatic attenuation).      Assessment & Plan:  1. Chest pain: The patient's episodes of chest pain are somewhat worrisome. He has multiple risk factors including diabetes mellitus, hypertension, hyperlipidemia. He admits to having these episodes of chest pain primarily with exertion but he also states that the pain gets worse with a deep breath.  He's had an abnormal Myoview study in the past. I think our best option is to proceed with cardiac catheterization given his history of an abnormal Myoview study and his chest discomfort.  We have discussed the risks, benefits, and options of cardiac cath He understands and agrees to proceed.      Vesta Mixer, Montez Hageman., MD, [facility] 05/25/2013, 12:09 PM  Office - [phone] Pager [phone]

## 2013-05-25 NOTE — Progress Notes (Signed)
UR completed 

## 2013-05-25 NOTE — Progress Notes (Signed)
  Echocardiogram 2D Echocardiogram has been performed.  Suellyn Meenan 05/25/2013, 10:12 AM

## 2013-05-26 ENCOUNTER — Observation Stay (HOSPITAL_COMMUNITY): Payer: Non-veteran care

## 2013-05-26 LAB — RESPIRATORY VIRUS PANEL
Adenovirus: NOT DETECTED
INFLUENZA A H1: NOT DETECTED
INFLUENZA A H3: NOT DETECTED
INFLUENZA A: NOT DETECTED
Influenza B: NOT DETECTED
Metapneumovirus: NOT DETECTED
PARAINFLUENZA 3 A: NOT DETECTED
Parainfluenza 1: NOT DETECTED
Parainfluenza 2: NOT DETECTED
RESPIRATORY SYNCYTIAL VIRUS B: NOT DETECTED
Respiratory Syncytial Virus A: NOT DETECTED
Rhinovirus: NOT DETECTED

## 2013-05-26 LAB — URINALYSIS, ROUTINE W REFLEX MICROSCOPIC
Bilirubin Urine: NEGATIVE
GLUCOSE, UA: NEGATIVE mg/dL
Hgb urine dipstick: NEGATIVE
Ketones, ur: NEGATIVE mg/dL
LEUKOCYTES UA: NEGATIVE
NITRITE: NEGATIVE
PH: 5.5 (ref 5.0–8.0)
Protein, ur: 30 mg/dL — AB
Specific Gravity, Urine: 1.026 (ref 1.005–1.030)
Urobilinogen, UA: 2 mg/dL — ABNORMAL HIGH (ref 0.0–1.0)

## 2013-05-26 LAB — URINE MICROSCOPIC-ADD ON

## 2013-05-26 MED ORDER — ASPIRIN EC 81 MG PO TBEC
81.0000 mg | DELAYED_RELEASE_TABLET | Freq: Every day | ORAL | Status: DC
  Filled 2013-05-26: qty 1

## 2013-05-26 MED ORDER — LEVOFLOXACIN IN D5W 750 MG/150ML IV SOLN
750.0000 mg | INTRAVENOUS | Status: DC
  Administered 2013-05-26: 03:00:00 750 mg via INTRAVENOUS
  Filled 2013-05-26: qty 150

## 2013-05-26 MED ORDER — ACETAMINOPHEN 325 MG PO TABS
650.0000 mg | ORAL_TABLET | Freq: Four times a day (QID) | ORAL | Status: DC | PRN
  Administered 2013-05-26: 01:00:00 650 mg via ORAL

## 2013-05-26 MED ORDER — SODIUM CHLORIDE 0.9 % IV BOLUS (SEPSIS)
500.0000 mL | Freq: Once | INTRAVENOUS | Status: AC
  Administered 2013-05-26: 500 mL via INTRAVENOUS

## 2013-05-26 NOTE — Progress Notes (Addendum)
RN paged this NP with pt's fever of 103. This NP ordered workup. Pt is refusing workup because [facility] (he was transferred earlier for card cath). AC has courier getting meds to pt this am, but he still refuses to cooperate.  KJKG, NP CXR resulted as bilateral infiltrates, PNA can not be excluded. Given fever, will go ahead and treat empirically with Levaquin IV for CAP.  KJKG, NP

## 2013-05-26 NOTE — Progress Notes (Signed)
Pt agitated after speaking with Dr.Jordan, stated "I am going home". Benefits of staying and risks of leaving, including death  explained to patient, pt made aware medical Dr. coming to floor to see him.  Pt refused to wait on medical Dr.  Nance PearIv's & telemetry removed, per Pt. Request.  Pt signed AMA form, and left floor with belongings.

## 2013-05-26 NOTE — Progress Notes (Signed)
Patient: Allen Wiggins / Admit Date: 05/24/2013 / Date of Encounter: 05/26/2013, 6:48 AM  Subjective  Denies any symptoms at all. No CP, SOB, nausea, aches, chills. TMax 103 overnight.    Objective   Telemetry: SR, brief acceleration to sinus tach, nonsustained idioventricular rhythm yesterday  Physical Exam: Blood pressure 128/61, pulse 89, temperature 98.9 F (37.2 C), temperature source Oral, resp. rate 18, height 6' 1.5" (1.867 m), weight 265 lb 3.4 oz (120.3 kg), SpO2 93.00%. General: Well developed, well nourished AAM in no acute distress. Laying flat in bed Head: Normocephalic, atraumatic, sclera non-icteric, no xanthomas, nares are without discharge. Neck: Negative for carotid bruits. JVP not elevated. Lungs: Coarse BS bilaterally, no obvious wheezes, rales, or rhonchi. Breathing is unlabored. Heart: RRR S1 S2 without murmurs, rubs, or gallops.  Abdomen: Soft, non-tender, non-distended with normoactive bowel sounds. No rebound/guarding. Extremities: No clubbing or cyanosis. No edema. Distal pedal pulses are 2+ and equal bilaterally. R radial site without complication Neuro: Alert and oriented X 3. Moves all extremities spontaneously. Psych:  Responds to questions with short answers, somewhat abrupt affect.   Intake/Output Summary (Last 24 hours) at 05/26/13 0648 Last data filed at 05/26/13 0029  Gross per 24 hour  Intake 1000.42 ml  Output   1001 ml  Net  -0.58 ml    Inpatient Medications:  . antiseptic oral rinse  15 mL Mouth Rinse BID  . aspirin EC  325 mg Oral Daily  . carvedilol  3.125 mg Oral BID WC  . cholecalciferol  2,000 Units Oral Daily  . heparin  5,000 Units Subcutaneous 3 times per day  . insulin aspart  0-9 Units Subcutaneous TID WC  . levofloxacin (LEVAQUIN) IV  750 mg Intravenous Q24H  . living well with diabetes book   Does not apply Once  . pantoprazole  40 mg Oral Daily  . simvastatin  20 mg Oral q1800   Infusions:  . sodium chloride 20 mL/hr at  05/24/13 2345    Labs:  Recent Labs  05/24/13 1036 05/25/13 0310 05/25/13 1855  NA 132* 135*  --   K 4.4 3.9  --   CL 92* 97  --   CO2 26 25  --   GLUCOSE 141* 152*  --   BUN 16 18  --   CREATININE 1.11 1.15 1.17  CALCIUM 9.8 8.9  --     Recent Labs  05/24/13 1036 05/25/13 0310  AST 20 15  ALT 13 9  ALKPHOS 92 76  BILITOT 0.4 0.9  PROT 8.5* 7.2  ALBUMIN 4.2 3.4*    Recent Labs  05/24/13 1036 05/25/13 0310 05/25/13 1855  WBC 10.4 7.8 8.9  NEUTROABS  --  4.1  --   HGB 13.7 12.0* 11.9*  HCT 39.4 36.5* 34.5*  MCV 86.4 87.5 86.9  PLT 316 271 253    Recent Labs  05/24/13 2016 05/25/13 0310 05/25/13 0930  TROPONINI <0.30 <0.30 <0.30   No components found with this basename: POCBNP,  No results found for this basename: HGBA1C,  in the last 72 hours   Radiology/Studies:  Dg Chest Port 1 View  05/26/2013   CLINICAL DATA:  Fever.  EXAM: PORTABLE CHEST - 1 VIEW  COMPARISON:  DG CHEST 1V PORT dated 05/24/2013  FINDINGS: Mediastinum and hilar structures are unremarkable. Cardiomegaly with pulmonary vascular prominence and bilateral infiltrates noted. These findings suggest congestive heart failure. Bilateral pneumonia cannot be excluded. Small left pleural effusion cannot be excluded. No pneumothorax.  IMPRESSION:  1. Cardiomegaly with pulmonary vascular congestion and bilateral pulmonary infiltrates suggesting congestive heart failure. 2. Bilateral pneumonia cannot be excluded.   Electronically Signed   By: Maisie Fus  Register   On: 05/26/2013 01:39   Dg Chest Port 1 View  05/24/2013   CLINICAL DATA:  Chest pain and shortness of breath.  EXAM: PORTABLE CHEST - 1 VIEW  COMPARISON:  None.  FINDINGS: The heart size is exaggerated by low lung volumes and AP technique. Mild pulmonary vascular congestion is evident. Minimal left basilar airspace disease likely reflects atelectasis. Early infection is not excluded.  IMPRESSION: 1. Low lung volumes and mild pulmonary vascular  congestion. 2. Left basilar airspace disease likely reflects atelectasis. Early infection is not excluded.   Electronically Signed   By: Gennette Pac M.D.   On: 05/24/2013 11:47     Assessment and Plan  1. Chest pain - question related to CAD vs PNA 2. CAD - very small distal LAD stenosis, for medical management which is limited by hypotension 3. Possible pneumonia (favor PNA over CHF given LVEDP 17 and fever) 4. HTN with hypotension this admission, improved 5. Diabetes mellitus 6. H/o noncompliance, leaving AMA in the past  Continue medical therapy for CAD, including aspirin, statin, beta blocker as BP allows. Can gradually resume home meds if BP remains stable. Rx of PNA per IM. Note that he is refusing workup/labs this AM because his home meds are still at [facility]. F/u labs this AM if he is agreeable. Per notes the courier will be getting meds to the patient his AM but he is still hardly cooperating with nursing. Nurse says he did agree to receive his Levaquin.  Signed, Ronie Spies PA-C Patient seen and examined and history reviewed. Agree with above findings and plan. Patient denies any chest pain or SOB. Denies cough. States there is nothing wrong with him and it can all be taken care of in [place]. Is planning to leave AMA. Fever to 103 last night. CXR shows bilateral pulmonary infiltrates L>R. Exam reveals bilateral rales with decreased BS on the left. While this could represent CHF I think it is more likely that this is PNA with his fever. LV function normal by cath with EDP of 17 mm Hg. Strongly encouraged patient to stay to complete his medical treatment but he is insistent on leaving.  Allen Wiggins 05/26/2013 7:12 AM

## 2013-05-26 NOTE — Progress Notes (Signed)
Pt experienced SVT with wide complex, pt asymptomatic, pt in no distress, VSS as charted will continue to monitor patient.

## 2013-05-26 NOTE — Progress Notes (Signed)
Pt refusing to have CBC, BMP, and blood cultures drawn due to the fact that his home meds are at [facility].  Spoke to [facility]), and she confirmed that meds would be brought by [facility]rier in am [facility] [place].  Importance of blood being drawn and fact that courier will bring meds in am explained to patient.  Patient still refusing blood draw, will continue to monitor patient.

## 2013-05-27 LAB — URINE CULTURE

## 2014-02-18 ENCOUNTER — Encounter (HOSPITAL_COMMUNITY): Payer: Self-pay | Admitting: Cardiology

## 2020-05-12 ENCOUNTER — Emergency Department (HOSPITAL_COMMUNITY): Payer: No Typology Code available for payment source

## 2020-05-12 ENCOUNTER — Encounter (HOSPITAL_COMMUNITY): Payer: Self-pay

## 2020-05-12 ENCOUNTER — Emergency Department (HOSPITAL_COMMUNITY)
Admission: EM | Admit: 2020-05-12 | Discharge: 2020-05-12 | Disposition: A | Discharge status: Home / Self Care | Payer: No Typology Code available for payment source | Attending: Emergency Medicine | Admitting: Emergency Medicine

## 2020-05-12 ENCOUNTER — Other Ambulatory Visit: Payer: Self-pay

## 2020-05-12 DIAGNOSIS — T1490XA Injury, unspecified, initial encounter: Secondary | ICD-10-CM

## 2020-05-12 DIAGNOSIS — I10 Essential (primary) hypertension: Secondary | ICD-10-CM | POA: Diagnosis not present

## 2020-05-12 DIAGNOSIS — E119 Type 2 diabetes mellitus without complications: Secondary | ICD-10-CM | POA: Diagnosis not present

## 2020-05-12 DIAGNOSIS — Z20822 Contact with and (suspected) exposure to covid-19: Secondary | ICD-10-CM | POA: Insufficient documentation

## 2020-05-12 DIAGNOSIS — S0101XA Laceration without foreign body of scalp, initial encounter: Secondary | ICD-10-CM | POA: Diagnosis not present

## 2020-05-12 DIAGNOSIS — S0990XA Unspecified injury of head, initial encounter: Secondary | ICD-10-CM | POA: Diagnosis present

## 2020-05-12 DIAGNOSIS — R000 Tachycardia, unspecified: Secondary | ICD-10-CM | POA: Diagnosis not present

## 2020-05-12 DIAGNOSIS — W19XXXA Unspecified fall, initial encounter: Secondary | ICD-10-CM

## 2020-05-12 DIAGNOSIS — W11XXXA Fall on and from ladder, initial encounter: Secondary | ICD-10-CM | POA: Diagnosis not present

## 2020-05-12 LAB — I-STAT CHEM 8, ED
BUN: 26 mg/dL — ABNORMAL HIGH (ref 8–23)
Calcium, Ion: 1.06 mmol/L — ABNORMAL LOW (ref 1.15–1.40)
Chloride: 105 mmol/L (ref 98–111)
Creatinine, Ser: 1.4 mg/dL — ABNORMAL HIGH (ref 0.61–1.24)
Glucose, Bld: 162 mg/dL — ABNORMAL HIGH (ref 70–99)
HCT: 33 % — ABNORMAL LOW (ref 39.0–52.0)
Hemoglobin: 11.2 g/dL — ABNORMAL LOW (ref 13.0–17.0)
Potassium: 4.1 mmol/L (ref 3.5–5.1)
Sodium: 139 mmol/L (ref 135–145)
TCO2: 21 mmol/L — ABNORMAL LOW (ref 22–32)

## 2020-05-12 LAB — URINALYSIS, ROUTINE W REFLEX MICROSCOPIC
Bacteria, UA: NONE SEEN
Bilirubin Urine: NEGATIVE
Glucose, UA: 50 mg/dL — AB
Ketones, ur: NEGATIVE mg/dL
Leukocytes,Ua: NEGATIVE
Nitrite: NEGATIVE
Protein, ur: NEGATIVE mg/dL
Specific Gravity, Urine: 1.044 — ABNORMAL HIGH (ref 1.005–1.030)
pH: 5 (ref 5.0–8.0)

## 2020-05-12 LAB — COMPREHENSIVE METABOLIC PANEL
ALT: 25 U/L (ref 0–44)
AST: 43 U/L — ABNORMAL HIGH (ref 15–41)
Albumin: 3.4 g/dL — ABNORMAL LOW (ref 3.5–5.0)
Alkaline Phosphatase: 64 U/L (ref 38–126)
Anion gap: 11 (ref 5–15)
BUN: 23 mg/dL (ref 8–23)
CO2: 21 mmol/L — ABNORMAL LOW (ref 22–32)
Calcium: 8.8 mg/dL — ABNORMAL LOW (ref 8.9–10.3)
Chloride: 105 mmol/L (ref 98–111)
Creatinine, Ser: 1.49 mg/dL — ABNORMAL HIGH (ref 0.61–1.24)
GFR, Estimated: 49 mL/min — ABNORMAL LOW (ref >60)
Glucose, Bld: 162 mg/dL — ABNORMAL HIGH (ref 70–99)
Potassium: 4.2 mmol/L (ref 3.5–5.1)
Sodium: 137 mmol/L (ref 135–145)
Total Bilirubin: 0.8 mg/dL (ref 0.3–1.2)
Total Protein: 6.9 g/dL (ref 6.5–8.1)

## 2020-05-12 LAB — CBC
HCT: 32.8 % — ABNORMAL LOW (ref 39.0–52.0)
Hemoglobin: 9.6 g/dL — ABNORMAL LOW (ref 13.0–17.0)
MCH: 23.1 pg — ABNORMAL LOW (ref 26.0–34.0)
MCHC: 29.3 g/dL — ABNORMAL LOW (ref 30.0–36.0)
MCV: 79 fL — ABNORMAL LOW (ref 80.0–100.0)
Platelets: 354 10*3/uL (ref 150–400)
RBC: 4.15 MIL/uL — ABNORMAL LOW (ref 4.22–5.81)
RDW: 17.6 % — ABNORMAL HIGH (ref 11.5–15.5)
WBC: 8.7 10*3/uL (ref 4.0–10.5)
nRBC: 0 % (ref 0.0–0.2)

## 2020-05-12 LAB — ETHANOL: Alcohol, Ethyl (B): <10 mg/dL (ref <10)

## 2020-05-12 LAB — RESP PANEL BY RT-PCR (FLU A&B, COVID) ARPGX2
Influenza A by PCR: NEGATIVE
Influenza B by PCR: NEGATIVE
SARS Coronavirus 2 by RT PCR: NEGATIVE

## 2020-05-12 LAB — SAMPLE TO BLOOD BANK

## 2020-05-12 LAB — PROTIME-INR
INR: 1.1 (ref 0.8–1.2)
Prothrombin Time: 13.3 seconds (ref 11.4–15.2)

## 2020-05-12 LAB — LACTIC ACID, PLASMA: Lactic Acid, Venous: 3.1 mmol/L (ref 0.5–1.9)

## 2020-05-12 MED ORDER — LACTATED RINGERS IV BOLUS
1000.0000 mL | Freq: Once | INTRAVENOUS | Status: AC
  Administered 2020-05-12: 1000 mL via INTRAVENOUS

## 2020-05-12 MED ORDER — OXYCODONE-ACETAMINOPHEN 5-325 MG PO TABS
1.0000 | ORAL_TABLET | Freq: Once | ORAL | Status: AC
  Administered 2020-05-12: 1 via ORAL
  Filled 2020-05-12: qty 1

## 2020-05-12 MED ORDER — LIDOCAINE-EPINEPHRINE 1 %-1:100000 IJ SOLN
10.0000 mL | Freq: Once | INTRAMUSCULAR | Status: AC
  Administered 2020-05-12: 10 mL via INTRADERMAL
  Filled 2020-05-12: qty 1

## 2020-05-12 MED ORDER — IOHEXOL 300 MG/ML  SOLN
100.0000 mL | Freq: Once | INTRAMUSCULAR | Status: AC | PRN
  Administered 2020-05-12: 100 mL via INTRAVENOUS

## 2020-05-12 MED ORDER — DILTIAZEM HCL 25 MG/5ML IV SOLN
20.0000 mg | Freq: Once | INTRAVENOUS | Status: AC
  Administered 2020-05-12: 20 mg via INTRAVENOUS
  Filled 2020-05-12: qty 5

## 2020-05-12 NOTE — Progress Notes (Signed)
CSW met with Pt in lobby. Pt had already been discharged but need transportation back to his home. Transportation arranged via [employer].   05/12/20 2208  TOC ED Mini Assessment  TOC Time spent with patient (minutes): 25  PING Used in TOC Assessment No  Admission or Readmission Diverted Yes  Interventions which prevented an admission or readmission Transportation Screening  What brought you to the Emergency Department?  Fall from tree  Barriers to Discharge No Barriers Identified  Barrier interventions Melburn Popper coordinated  Means of departure Taxi Melburn Popper)

## 2020-05-12 NOTE — Discharge Instructions (Addendum)
[facility] will contact you regarding scheduling a follow up appointment to evaluation. Please also follow up with your PCP at the [facility] as soon as possible to discuss further evaluation for possible Atrial fibrillation and need for medication and anticoagulation.  Please follow up with your PCP on return to the ED in 7 days to have staple to your scalp removed.

## 2020-05-12 NOTE — ED Notes (Signed)
Pt sitting on side of the bed eating sandwich

## 2020-05-12 NOTE — ED Notes (Signed)
Critical lactic acid level given to Dr Stevie Kern and resident, pending order for IV fluids

## 2020-05-12 NOTE — ED Notes (Signed)
Social work provided with patient information, will arrange ride for patient. Patient made aware of same.

## 2020-05-12 NOTE — ED Notes (Signed)
Pt ambulated around the room, unsteady on his feet but states that is normal for him, c.o right hip pain

## 2020-05-12 NOTE — ED Provider Notes (Signed)
[facility] Provider Note   CSN: [id number] Arrival date & time: 05/12/20  1502     History Chief Complaint: fall  Roel CluckClayton V Barley Jr. is a 75 y.o. male w/ h/o HTN, HLD, T2DM, and GERD who presents to the ED from home via EMS for level 2 trauma activation following fall. Patient on 1524ft ladder in a tree cutting a limb when the limb fell and hit the ladder, causing patient to fall approximately 15-6520ft. No LOC. No prodromal symptoms. EMS called to scene by neighbor. EMS reported GCS 14 due to repeated questioning and some confusion. VS stable in route. C-collar applied. Upon arrival to ED, GCS 14, ABCs intact, and HDS. Patient complaining of headache. No anticoagulation.  The history is provided by the patient, the EMS personnel and medical records.  Trauma Mechanism of injury: fall Injury location: head/neck Injury location detail: head Incident location: outdoors Time since incident: just prior to arrival. Arrived directly from scene: yes   Fall:      Fall occurred: from a ladder      Height of fall: 15-5220ft      Impact surface: grass      Point of impact: unknown      Entrapped after fall: no  Protective equipment:       None      Suspicion of alcohol use: no      Suspicion of drug use: no  EMS/PTA data:      Bystander interventions: bystander C-spine precautions      Ambulatory at scene: no      Blood loss: minimal      Responsiveness: alert      Oriented to: person, place, situation and time      Loss of consciousness: no      Amnesic to event: no      Airway interventions: none      Breathing interventions: none      IV access: established      IO access: none      Fluids administered: none      Cardiac interventions: none      Medications administered: none      Immobilization: C-collar      Airway condition since incident: stable      Breathing condition since incident: stable      Circulation condition since incident:  stable      Mental status condition since incident: stable      Disability condition since incident: stable  Current symptoms:      Pain quality: unable to describe      Pain timing: constant      Associated symptoms:            Reports headache.            Denies abdominal pain, back pain, chest pain, difficulty breathing, loss of consciousness, nausea, neck pain, seizures and vomiting.   Relevant PMH:      Pharmacological risk factors:            No anticoagulation therapy or antiplatelet therapy.       Tetanus status: UTD      History reviewed. No pertinent past medical history.  There are no problems to display for this patient.   History reviewed. No pertinent surgical history.     History reviewed. No pertinent family history.     Home Medications Prior to Admission medications   Not on File    Allergies  Patient has no allergy information on record.  Review of Systems   Review of Systems  Constitutional: Negative for chills and fever.  HENT: Negative for ear pain and sore throat.   Eyes: Negative for pain and visual disturbance.  Respiratory: Negative for cough and shortness of breath.   Cardiovascular: Negative for chest pain and palpitations.  Gastrointestinal: Negative for abdominal pain, nausea and vomiting.  Genitourinary: Negative for dysuria and hematuria.  Musculoskeletal: Negative for arthralgias, back pain and neck pain.  Skin: Positive for wound. Negative for color change and rash.  Neurological: Positive for headaches. Negative for seizures, loss of consciousness and syncope.  All other systems reviewed and are negative.   Physical Exam Updated Vital Signs BP 134/77    Pulse 70    Temp 98.7 F (37.1 C) (Oral)    Resp 13    Ht  (1.854 m)    Wt 79.4 kg    SpO2 96%    BMI 23.09 kg/m   Physical Exam Vitals and nursing note reviewed. Exam conducted with a chaperone present.  Constitutional:      General: He is awake. He is not in  acute distress.    Appearance: He is well-developed and well-groomed. He is obese. He is not ill-appearing.     Interventions: Cervical collar in place.  HENT:     Head: Normocephalic. Laceration present. No raccoon eyes, Battle's sign, contusion, right periorbital erythema or left periorbital erythema.     Jaw: There is normal jaw occlusion.     Comments: 1cm linear hemostatic laceration to L parietal scalp.    Right Ear: External ear normal.     Left Ear: External ear normal.     Nose: No nasal deformity, septal deviation or nasal tenderness.     Right Nostril: No epistaxis or septal hematoma.     Left Nostril: No epistaxis or septal hematoma.     Comments: Dried blood noted to BLT nares    Mouth/Throat:     Lips: Pink. No lesions.     Mouth: Mucous membranes are moist. No injury, lacerations, oral lesions or angioedema.     Tongue: No lesions.     Palate: No lesions.     Pharynx: Oropharynx is clear. Uvula midline. No pharyngeal swelling or posterior oropharyngeal erythema.  Eyes:     General: No visual field deficit or scleral icterus.       Right eye: No discharge.        Left eye: No discharge.     Extraocular Movements: Extraocular movements intact.     Conjunctiva/sclera: Conjunctivae normal.     Pupils: Pupils are equal, round, and reactive to light.  Neck:     Trachea: Trachea normal.  Cardiovascular:     Rate and Rhythm: Regular rhythm. Tachycardia present.     Pulses: Normal pulses.     Heart sounds: Normal heart sounds.  Pulmonary:     Effort: Pulmonary effort is normal. No respiratory distress.     Breath sounds: Normal breath sounds. No wheezing or rales.     Comments: Chest and clavicles stable to compression and nontender without overlying skin changes. Chest:     Chest wall: No tenderness.  Abdominal:     General: Abdomen is flat. There is no distension.     Palpations: Abdomen is soft.     Tenderness: There is no abdominal tenderness. There is no guarding  or rebound.  Musculoskeletal:        General:  No tenderness, deformity or signs of injury.     Cervical back: No pain with movement, spinous process tenderness or muscular tenderness.     Right lower leg: No edema.     Left lower leg: No edema.     Comments: Extremities atraumatic and NVI. No C/T/L spine tenderness, step offs, or deformities.  Skin:    General: Skin is warm and dry.     Capillary Refill: Capillary refill takes less than 2 seconds.     Findings: No rash.  Neurological:     General: No focal deficit present.     Mental Status: He is alert and oriented to person, place, and time.     GCS: GCS eye subscore is 4. GCS verbal subscore is 5. GCS motor subscore is 6.     Cranial Nerves: No dysarthria or facial asymmetry.     Sensory: Sensation is intact. No sensory deficit.     Motor: Motor function is intact. No weakness.  Psychiatric:        Behavior: Behavior is cooperative.     ED Results / Procedures / Treatments   Labs (all labs ordered are listed, but only abnormal results are displayed) Labs Reviewed  COMPREHENSIVE METABOLIC PANEL - Abnormal; Notable for the following components:      Result Value   CO2 21 (*)    Glucose, Bld 162 (*)    Creatinine, Ser 1.49 (*)    Calcium 8.8 (*)    Albumin 3.4 (*)    AST 43 (*)    GFR, Estimated 49 (*)    All other components within normal limits  CBC - Abnormal; Notable for the following components:   RBC 4.15 (*)    Hemoglobin 9.6 (*)    HCT 32.8 (*)    MCV 79.0 (*)    MCH 23.1 (*)    MCHC 29.3 (*)    RDW 17.6 (*)    All other components within normal limits  URINALYSIS, ROUTINE W REFLEX MICROSCOPIC - Abnormal; Notable for the following components:   Specific Gravity, Urine 1.044 (*)    Glucose, UA 50 (*)    Hgb urine dipstick MODERATE (*)    All other components within normal limits  LACTIC ACID, PLASMA - Abnormal; Notable for the following components:   Lactic Acid, Venous 3.1 (*)    All other components  within normal limits  I-STAT CHEM 8, ED - Abnormal; Notable for the following components:   BUN 26 (*)    Creatinine, Ser 1.40 (*)    Glucose, Bld 162 (*)    Calcium, Ion 1.06 (*)    TCO2 21 (*)    Hemoglobin 11.2 (*)    HCT 33.0 (*)    All other components within normal limits  RESP PANEL BY RT-PCR (FLU A&B, COVID) ARPGX2  ETHANOL  PROTIME-INR  SAMPLE TO BLOOD BANK    EKG None  Radiology CT HEAD WO CONTRAST  Result Date: 05/12/2020 CLINICAL DATA:  Larey Seat from a ladder. Head and cervical spine injury suspected. EXAM: CT HEAD WITHOUT CONTRAST CT MAXILLOFACIAL WITHOUT CONTRAST CT CERVICAL SPINE WITHOUT CONTRAST TECHNIQUE: Multidetector CT imaging of the head, cervical spine, and maxillofacial structures were performed using the standard protocol without intravenous contrast. Multiplanar CT image reconstructions of the cervical spine and maxillofacial structures were also generated. COMPARISON:  None. FINDINGS: CT HEAD FINDINGS Brain: Mildly enlarged ventricles and cortical sulci. No intracranial hemorrhage, mass lesion or CT evidence of acute infarction. Vascular: No hyperdense vessel or  unexpected calcification. Skull: Normal. Negative for fracture or focal lesion. Other: None. CT MAXILLOFACIAL FINDINGS Osseous: No fractures. Orbits: Status post bilateral cataract extraction. Sinuses: Bilateral sphenoid sinus air-fluid levels. Small amount of mucosal thickening or retained secretions in the posterior right maxillary sinus. Soft tissues: Unremarkable. CT CERVICAL SPINE FINDINGS Alignment: Normal. Skull base and vertebrae: No acute fracture. No primary bone lesion or focal pathologic process. Soft tissues and spinal canal: No prevertebral fluid or swelling. No visible canal hematoma. Disc levels:  Lower cervical spine degenerative changes. Upper chest: Clear lung apices. Other: Minimal bilateral carotid artery calcification. IMPRESSION: 1. No skull fracture or intracranial hemorrhage. 2. No  maxillofacial fracture. 3. No cervical spine fracture or subluxation. 4. Lower cervical spine degenerative changes. 5. Minimal bilateral carotid artery atheromatous calcification. 6. Bilateral acute sphenoid sinusitis. Electronically Signed   By: Beckie Salts M.D.   On: 05/12/2020 16:16   CT CERVICAL SPINE WO CONTRAST  Result Date: 05/12/2020 CLINICAL DATA:  Larey Seat from a ladder. Head and cervical spine injury suspected. EXAM: CT HEAD WITHOUT CONTRAST CT MAXILLOFACIAL WITHOUT CONTRAST CT CERVICAL SPINE WITHOUT CONTRAST TECHNIQUE: Multidetector CT imaging of the head, cervical spine, and maxillofacial structures were performed using the standard protocol without intravenous contrast. Multiplanar CT image reconstructions of the cervical spine and maxillofacial structures were also generated. COMPARISON:  None. FINDINGS: CT HEAD FINDINGS Brain: Mildly enlarged ventricles and cortical sulci. No intracranial hemorrhage, mass lesion or CT evidence of acute infarction. Vascular: No hyperdense vessel or unexpected calcification. Skull: Normal. Negative for fracture or focal lesion. Other: None. CT MAXILLOFACIAL FINDINGS Osseous: No fractures. Orbits: Status post bilateral cataract extraction. Sinuses: Bilateral sphenoid sinus air-fluid levels. Small amount of mucosal thickening or retained secretions in the posterior right maxillary sinus. Soft tissues: Unremarkable. CT CERVICAL SPINE FINDINGS Alignment: Normal. Skull base and vertebrae: No acute fracture. No primary bone lesion or focal pathologic process. Soft tissues and spinal canal: No prevertebral fluid or swelling. No visible canal hematoma. Disc levels:  Lower cervical spine degenerative changes. Upper chest: Clear lung apices. Other: Minimal bilateral carotid artery calcification. IMPRESSION: 1. No skull fracture or intracranial hemorrhage. 2. No maxillofacial fracture. 3. No cervical spine fracture or subluxation. 4. Lower cervical spine degenerative changes. 5.  Minimal bilateral carotid artery atheromatous calcification. 6. Bilateral acute sphenoid sinusitis. Electronically Signed   By: Beckie Salts M.D.   On: 05/12/2020 16:16   DG Pelvis Portable  Result Date: 05/12/2020 CLINICAL DATA:  Trauma. EXAM: PORTABLE PELVIS 1-2 VIEWS COMPARISON:  None. FINDINGS: There is no evidence of pelvic fracture or diastasis. No pelvic bone lesions are seen. IMPRESSION: Negative. Electronically Signed   By: Lupita Raider M.D.   On: 05/12/2020 15:54   CT CHEST ABDOMEN PELVIS W CONTRAST  Result Date: 05/12/2020 CLINICAL DATA:  Larey Seat from a ladder. EXAM: CT CHEST, ABDOMEN, AND PELVIS WITH CONTRAST TECHNIQUE: Multidetector CT imaging of the chest, abdomen and pelvis was performed following the standard protocol during bolus administration of intravenous contrast. CONTRAST:  OMNIPAQUE IOHEXOL 300 MG/ML  SOLN COMPARISON:  Portable chest and portable pelvis obtained today. FINDINGS: CT CHEST FINDINGS Cardiovascular: Atheromatous calcifications, including the coronary arteries and aorta. Normal sized heart. Mediastinum/Nodes: Mildly prominent mediastinal nodes with no abnormally enlarged nodes demonstrated. Normal appearing thyroid gland and esophagus. Lungs/Pleura: Minimal patchy opacity in the anterior left upper lobe and lingula. Subsegmental atelectasis or scarring in the posterior aspect of the lingula. No pneumothorax or pleural fluid. Mild bilateral noncalcified pleural plaque formation. Musculoskeletal: Mild thoracic  spine degenerative changes. Lower cervical spine degenerative changes. No fractures, dislocations or subluxations. There are multiple old, healed left posterior rib fractures. CT ABDOMEN PELVIS FINDINGS Hepatobiliary: No focal liver abnormality is seen. No gallstones, gallbladder wall thickening, or biliary dilatation. Pancreas: Unremarkable. No pancreatic ductal dilatation or surrounding inflammatory changes. Spleen: Normal in size without focal abnormality.  Adrenals/Urinary Tract: Adrenal glands are unremarkable. Kidneys are normal, without renal calculi, focal lesion, or hydronephrosis. Bladder is unremarkable. Stomach/Bowel: Stomach is within normal limits. Appendix appears normal. No evidence of bowel wall thickening, distention, or inflammatory changes. Vascular/Lymphatic: Mild atheromatous arterial calcifications. No enlarged lymph nodes. Reproductive: Moderate to marked enlargement of the prostate gland. Other: Small right inguinal hernia containing fat. Musculoskeletal: Lumbar and lower thoracic spine degenerative changes. No fractures, subluxations or dislocations. IMPRESSION: 1. Minimal patchy opacity in the anterior left upper lobe and lingula, possibly representing areas of minimal pulmonary contusion. 2. No internal organ injury in the abdomen and pelvis and no acute fractures. 3. Multiple old, healed left posterior rib fractures. 4. Moderate to marked prostatic hypertrophy. 5. Calcific coronary artery and aortic atherosclerosis. Aortic Atherosclerosis (ICD10-I70.0). Electronically Signed   By: Beckie Salts M.D.   On: 05/12/2020 16:07   DG Chest Port 1 View  Result Date: 05/12/2020 CLINICAL DATA:  Trauma. EXAM: PORTABLE CHEST 1 VIEW COMPARISON:  None. FINDINGS: Mild cardiomegaly. No pneumothorax or pleural effusion is noted. Both lungs are clear. The visualized skeletal structures are unremarkable. IMPRESSION: No active disease. Electronically Signed   By: Lupita Raider M.D.   On: 05/12/2020 15:55   CT Maxillofacial Wo Contrast  Result Date: 05/12/2020 CLINICAL DATA:  Larey Seat from a ladder. Head and cervical spine injury suspected. EXAM: CT HEAD WITHOUT CONTRAST CT MAXILLOFACIAL WITHOUT CONTRAST CT CERVICAL SPINE WITHOUT CONTRAST TECHNIQUE: Multidetector CT imaging of the head, cervical spine, and maxillofacial structures were performed using the standard protocol without intravenous contrast. Multiplanar CT image reconstructions of the cervical spine  and maxillofacial structures were also generated. COMPARISON:  None. FINDINGS: CT HEAD FINDINGS Brain: Mildly enlarged ventricles and cortical sulci. No intracranial hemorrhage, mass lesion or CT evidence of acute infarction. Vascular: No hyperdense vessel or unexpected calcification. Skull: Normal. Negative for fracture or focal lesion. Other: None. CT MAXILLOFACIAL FINDINGS Osseous: No fractures. Orbits: Status post bilateral cataract extraction. Sinuses: Bilateral sphenoid sinus air-fluid levels. Small amount of mucosal thickening or retained secretions in the posterior right maxillary sinus. Soft tissues: Unremarkable. CT CERVICAL SPINE FINDINGS Alignment: Normal. Skull base and vertebrae: No acute fracture. No primary bone lesion or focal pathologic process. Soft tissues and spinal canal: No prevertebral fluid or swelling. No visible canal hematoma. Disc levels:  Lower cervical spine degenerative changes. Upper chest: Clear lung apices. Other: Minimal bilateral carotid artery calcification. IMPRESSION: 1. No skull fracture or intracranial hemorrhage. 2. No maxillofacial fracture. 3. No cervical spine fracture or subluxation. 4. Lower cervical spine degenerative changes. 5. Minimal bilateral carotid artery atheromatous calcification. 6. Bilateral acute sphenoid sinusitis. Electronically Signed   By: Beckie Salts M.D.   On: 05/12/2020 16:16    Procedures .Marland KitchenLaceration Repair  Date/Time: 05/12/2020 5:13 PM Performed by: Tonia Brooms, MD Authorized by: Milagros Loll, MD   Consent:    Consent obtained:  Verbal   Consent given by:  Patient   Risks, benefits, and alternatives were discussed: yes     Risks discussed:  Infection, pain, poor cosmetic result and poor wound healing   Alternatives discussed:  No treatment Universal protocol:  Procedure explained and questions answered to patient or proxy's satisfaction: yes     Test results available: yes     Imaging studies available: yes      Site/side marked: yes     Immediately prior to procedure, a time out was called: yes     Patient identity confirmed:  Verbally with patient and arm band Anesthesia:    Anesthesia method:  Local infiltration   Local anesthetic:  Lidocaine 2% WITH epi Laceration details:    Location:  Scalp   Scalp location:  R parietal   Length (cm):  1   Depth (mm):  2 Pre-procedure details:    Preparation:  Imaging obtained to evaluate for foreign bodies Exploration:    Limited defect created (wound extended): no     Hemostasis achieved with:  Direct pressure   Imaging obtained comment:  CT head   Imaging outcome: foreign body not noted     Wound exploration: entire depth of wound visualized     Wound extent: no foreign bodies/material noted, no muscle damage noted, no nerve damage noted, no underlying fracture noted and no vascular damage noted     Contaminated: no   Treatment:    Area cleansed with:  Soap and water   Amount of cleaning:  Standard   Irrigation solution:  Sterile saline   Irrigation method:  Syringe   Visualized foreign bodies/material removed: no     Debridement:  None   Undermining:  None   Scar revision: no   Skin repair:    Repair method:  Staples   Number of staples:  1 Approximation:    Approximation:  Close Repair type:    Repair type:  Simple Post-procedure details:    Dressing:  Open (no dressing)   Procedure completion:  Tolerated well, no immediate complications    Medications Ordered in ED Medications  iohexol (OMNIPAQUE) 300 MG/ML solution 100 mL (100 mLs Intravenous Contrast Given 05/12/20 1538)  lactated ringers bolus 1,000 mL (0 mLs Intravenous Stopped 05/12/20 1726)  oxyCODONE-acetaminophen (PERCOCET/ROXICET) 5-325 MG per tablet 1 tablet (1 tablet Oral Given 05/12/20 1641)  lidocaine-EPINEPHrine (XYLOCAINE W/EPI) 1 %-1:100000 (with pres) injection 10 mL (10 mLs Intradermal Given by Other 05/12/20 1705)  diltiazem (CARDIZEM) injection 20 mg (20 mg Intravenous  Given 05/12/20 1931)    ED Course  I have reviewed the triage vital signs and the nursing notes.  Pertinent labs & imaging results that were available during my care of the patient were reviewed by me and considered in my medical decision making (see chart for details).    MDM Rules/Calculators/A&P     CHA2DS2-VASc Score: 4                     Patient is a 74yoM with history and physical as described above who presents to the ED for an activated level 2 trauma after suffering fall from ladder. Upon EMS arrival, ABCs intact, GCS 15, and HDS. Initially on NRB mask for comfort by EMS but removed and patient maintained appropriate O2 sats on RA without respiratory distress. Initial HR 140s with narrow complex QRS possible 2/2 SVT or atrial flutter; patient spontaneously converted several minutes after arrival to ED back into normal sinus rhythm. Initial ECG reassuring with no acute ischemic, dysrhythmic, or interval changes. Patient fully exposed with no obvious signs of trauma on exam with exception of dried blood noted to BLT nares. Portably CXR and pelvis reassuring. Patient then sent to CT for  full trauma scans.  CTs notable for possible minimal LUL pulmonary contusion. No other acute traumatic injuries on exam. Patient maintaining appropriate O2 sats on RA with no acute respiratory distress. Labs notable for lactic acid 3.1. Cr 1.49 and appears at baseline. Lactic acid elevated however VS reassuring and no signs of acute traumatic injury; suspect likely secondary to stress response. 1L IVF bolus given.  Scalp lac cleaned and irrigated. Closed with single staple. Will need follow up for removal in 1 week.  Prior to discharge, HR to 140s. ECG notable for atrial tachycardia, Afib vs Aflutter. No response to vagal maneuvers. HR improved and converted to sinus rhythm after single dose of IV diltiazem. Ambulatory referral to Afib Clinic placed. Recommend close follow up with PCP as well for further  evaluation and consideration of rate control medication and anticoagulation. Strict return precautions provided and discussed. Questions and concerns addressed. Patient verbalized understanding and amenable with discharge plan. Discharged in stable condition.  Final Clinical Impression(s) / ED Diagnoses Final diagnoses:  Trauma  Fall, initial encounter  Laceration of scalp, initial encounter    Rx / DC Orders ED Discharge Orders         Ordered    Amb Referral to AFIB Clinic       Comments: Questionable intermittent atrial tachycardia (unsure Afib vs Aflutter) Route recommendations to PCP   05/12/20 2053           Tonia Brooms, MD 05/13/20 0201    Milagros Loll, MD 05/14/20 [phone]

## 2020-05-19 ENCOUNTER — Encounter (HOSPITAL_COMMUNITY): Payer: Self-pay | Admitting: *Deleted

## 2020-05-19 ENCOUNTER — Other Ambulatory Visit: Payer: Self-pay

## 2020-05-19 ENCOUNTER — Emergency Department (HOSPITAL_COMMUNITY)
Admission: EM | Admit: 2020-05-19 | Discharge: 2020-05-19 | Disposition: A | Discharge status: Home / Self Care | Payer: No Typology Code available for payment source | Attending: Emergency Medicine | Admitting: Emergency Medicine

## 2020-05-19 DIAGNOSIS — Z79899 Other long term (current) drug therapy: Secondary | ICD-10-CM | POA: Insufficient documentation

## 2020-05-19 DIAGNOSIS — Z7984 Long term (current) use of oral hypoglycemic drugs: Secondary | ICD-10-CM | POA: Insufficient documentation

## 2020-05-19 DIAGNOSIS — Z87891 Personal history of nicotine dependence: Secondary | ICD-10-CM | POA: Diagnosis not present

## 2020-05-19 DIAGNOSIS — S0990XD Unspecified injury of head, subsequent encounter: Secondary | ICD-10-CM | POA: Diagnosis present

## 2020-05-19 DIAGNOSIS — E119 Type 2 diabetes mellitus without complications: Secondary | ICD-10-CM | POA: Diagnosis not present

## 2020-05-19 DIAGNOSIS — Z4802 Encounter for removal of sutures: Secondary | ICD-10-CM | POA: Diagnosis not present

## 2020-05-19 DIAGNOSIS — I10 Essential (primary) hypertension: Secondary | ICD-10-CM | POA: Insufficient documentation

## 2020-05-19 DIAGNOSIS — X58XXXD Exposure to other specified factors, subsequent encounter: Secondary | ICD-10-CM | POA: Insufficient documentation

## 2020-05-19 DIAGNOSIS — S0101XD Laceration without foreign body of scalp, subsequent encounter: Secondary | ICD-10-CM | POA: Diagnosis not present

## 2020-05-19 NOTE — Discharge Instructions (Signed)
May return to work.  If you notice redness, bleeding or drainage from wound seek reevaluation

## 2020-05-19 NOTE — ED Triage Notes (Signed)
States he had a staple put in his head last wed and is here to have it removed.

## 2020-05-19 NOTE — ED Provider Notes (Signed)
[facility] EMERGENCY DEPARTMENT Provider Note   CSN: [id number] Arrival date & time: 05/19/20  1050     History Chief Complaint  Patient presents with  . Suture / Staple Removal    Allen Wiggins is a 75 y.o. male past medical history significant for diabetes, hypertension presents for evaluation of staple removal.  Had fall 1 week ago.  1 staple placed left lateral head.  Patient denies any headache, lightness, dizziness, fever, chills, nausea, vomiting, syncope, pain, bleeding or drainage.  He would like a note to go back to work.  Denies additional aggravating or alleviating factors.  Pain rated 0/10.  No paresthesias or weakness  History obtained from patient and past medical history.  No interpreter is used  HPI     Past Medical History:  Diagnosis Date  . Diabetes mellitus without complication (HCC)   . GERD (gastroesophageal reflux disease)   . Hypertension   . Peptic ulcer of esophagus     Patient Active Problem List   Diagnosis Date Noted  . Chest pain 05/24/2013  . Diabetes mellitus (HCC) 05/24/2013  . HTN (hypertension) 05/24/2013  . HLD (hyperlipidemia) 05/24/2013    Past Surgical History:  Procedure Laterality Date  . HERNIA REPAIR    . LEFT HEART CATHETERIZATION WITH CORONARY ANGIOGRAM N/A 05/25/2013   Procedure: LEFT HEART CATHETERIZATION WITH CORONARY ANGIOGRAM;  Surgeon: Peter M Swaziland, MD;  Location: [facility] CATH LAB;  Service: Cardiovascular;  Laterality: N/A;       Family History  Problem Relation Age of Onset  . Skin cancer Mother   . Diabetes Mellitus II Father   . Stroke Sister   . CAD Neg Hx     Social History   Tobacco Use  . Smoking status: Former Smoker    Types: Cigarettes  . Smokeless tobacco: Never Used  Substance Use Topics  . Alcohol use: Yes    Comment: ocassional    Home Medications Prior to Admission medications   Medication Sig Start Date End Date Taking? Authorizing Provider  carvedilol (COREG)  12.5 MG tablet Take 6.25 mg by mouth 2 (two) times daily with a meal.    [provider]  cholecalciferol (VITAMIN D) 1000 UNITS tablet Take 2,000 Units by mouth daily.    [provider]  lisinopril-hydrochlorothiazide (PRINZIDE,ZESTORETIC) 20-12.5 MG per tablet Take 1 tablet by mouth daily.    [provider]  metFORMIN (GLUCOPHAGE) 500 MG tablet Take 500 mg by mouth 2 (two) times daily with a meal.    [provider]  nitroGLYCERIN (NITROSTAT) 0.4 MG SL tablet Place 1 tablet (0.4 mg total) under the tongue every 5 (five) minutes as needed for chest pain. 05/24/13   Trixie Dredge, PA-C  pantoprazole (PROTONIX) 40 MG tablet Take 40 mg by mouth daily.    [provider]  pravastatin (PRAVACHOL) 40 MG tablet Take 40 mg by mouth at bedtime.    [provider]    Allergies    Patient has no known allergies.  Review of Systems   Review of Systems  Constitutional: Negative.   HENT: Negative.   Respiratory: Negative.   Cardiovascular: Negative.   Gastrointestinal: Negative.   Genitourinary: Negative.   Musculoskeletal: Negative.   Skin: Positive for wound.  Neurological: Negative.   All other systems reviewed and are negative.   Physical Exam Updated Vital Signs BP (!) 157/81 (BP Location: Right Arm)   Pulse 61   Temp 97.6 F (36.4 C)   Resp  18   Ht 6\' 1"  (1.854 m)   Wt 79.4 kg   SpO2 100%   BMI 23.09 kg/m   Physical Exam Vitals and nursing note reviewed.  Constitutional:      General: He is not in acute distress.    Appearance: He is well-developed. He is not ill-appearing, toxic-appearing or diaphoretic.  HENT:     Head: Normocephalic and atraumatic.     Nose: Nose normal.     Mouth/Throat:     Mouth: Mucous membranes are moist.  Eyes:     Pupils: Pupils are equal, round, and reactive to light.  Cardiovascular:     Rate and Rhythm: Normal rate and regular rhythm.     Pulses: Normal pulses.     Heart sounds: Normal  heart sounds.  Pulmonary:     Effort: Pulmonary effort is normal. No respiratory distress.  Abdominal:     General: There is no distension.     Palpations: Abdomen is soft.  Musculoskeletal:        General: Normal range of motion.     Cervical back: Normal range of motion and neck supple.     Comments: Moves all 4 extremities without difficulty.   Skin:    General: Skin is warm and dry.     Capillary Refill: Capillary refill takes less than 2 seconds.     Comments: # 1  Stable to left lateral head.  No bleeding, drainage, redness or warmth  Neurological:     General: No focal deficit present.     Mental Status: He is alert and oriented to person, place, and time.     Comments: Cranial nerves II through XII grossly intact Ambulatory without difficulty     ED Results / Procedures / Treatments   Labs (all labs ordered are listed, but only abnormal results are displayed) Labs Reviewed - No data to display  EKG None  Radiology No results found.  Procedures .Suture Removal  Date/Time: 05/19/2020 12:05 PM Performed by: 07/19/2020, PA-C Authorized by: Linwood Dibbles, PA-C   Consent:    Consent obtained:  Verbal   Consent given by:  Patient   Risks, benefits, and alternatives were discussed: yes     Risks discussed:  Bleeding, pain and wound separation   Alternatives discussed:  Delayed treatment, no treatment, alternative treatment, observation and referral Universal protocol:    Procedure explained and questions answered to patient or proxy's satisfaction: yes     Relevant documents present and verified: yes     Test results available: yes     Imaging studies available: yes     Required blood products, implants, devices, and special equipment available: yes     Site/side marked: yes     Immediately prior to procedure, a time out was called: yes     Patient identity confirmed:  Verbally with patient Location:    Location:  Head/neck   Head/neck location:   Scalp Procedure details:    Wound appearance:  No signs of infection and good wound healing   Number of sutures removed:  0   Number of staples removed:  1 Post-procedure details:    Post-removal:  No dressing applied   Procedure completion:  Tolerated well, no immediate complications     Medications Ordered in ED Medications - No data to display  ED Course  I have reviewed the triage vital signs and the nursing notes.  Pertinent labs & imaging results that were available during my  care of the patient were reviewed by me and considered in my medical decision making (see chart for details).  Here for evaluation of staple removal.  Occurred last week.  Asymptomatic.  Denies any infectious symptoms. #1 staple removed.  He is requesting a work note to return.  Will write.  The patient has been appropriately medically screened and/or stabilized in the ED. I have low suspicion for any other emergent medical condition which would require further screening, evaluation or treatment in the ED or require inpatient management.  Patient is hemodynamically stable and in no acute distress.  Patient able to ambulate in department prior to ED.  Evaluation does not show acute pathology that would require ongoing or additional emergent interventions while in the emergency department or further inpatient treatment.  I have discussed the diagnosis with the patient and answered all questions.  Pain is been managed while in the emergency department and patient has no further complaints prior to discharge.  Patient is comfortable with plan discussed in room and is stable for discharge at this time.  I have discussed strict return precautions for returning to the emergency department.  Patient was encouraged to follow-up with PCP/specialist refer to at discharge.    MDM Rules/Calculators/A&P                           Final Clinical Impression(s) / ED Diagnoses Final diagnoses:  Encounter for staple removal     Rx / DC Orders ED Discharge Orders    None       Carrieann Spielberg A, PA-C 05/19/20 1205    Tegeler, Canary Brim, MD 05/19/20 1657

## 2021-07-12 ENCOUNTER — Encounter (HOSPITAL_COMMUNITY): Payer: Self-pay | Admitting: Emergency Medicine

## 2021-07-12 ENCOUNTER — Inpatient Hospital Stay (HOSPITAL_COMMUNITY)
Admission: EM | Admit: 2021-07-12 | Discharge: 2021-07-15 | DRG: 394 | Disposition: A | Discharge status: Home / Self Care | Payer: No Typology Code available for payment source | Attending: Internal Medicine | Admitting: Internal Medicine

## 2021-07-12 ENCOUNTER — Other Ambulatory Visit: Payer: Self-pay

## 2021-07-12 DIAGNOSIS — Z6833 Body mass index (BMI) 33.0-33.9, adult: Secondary | ICD-10-CM

## 2021-07-12 DIAGNOSIS — K2289 Other specified disease of esophagus: Secondary | ICD-10-CM | POA: Diagnosis not present

## 2021-07-12 DIAGNOSIS — Z823 Family history of stroke: Secondary | ICD-10-CM | POA: Diagnosis not present

## 2021-07-12 DIAGNOSIS — I10 Essential (primary) hypertension: Secondary | ICD-10-CM | POA: Diagnosis present

## 2021-07-12 DIAGNOSIS — D62 Acute posthemorrhagic anemia: Secondary | ICD-10-CM | POA: Diagnosis present

## 2021-07-12 DIAGNOSIS — K31811 Angiodysplasia of stomach and duodenum with bleeding: Secondary | ICD-10-CM | POA: Diagnosis not present

## 2021-07-12 DIAGNOSIS — E119 Type 2 diabetes mellitus without complications: Secondary | ICD-10-CM | POA: Diagnosis present

## 2021-07-12 DIAGNOSIS — K6381 Dieulafoy lesion of intestine: Secondary | ICD-10-CM | POA: Diagnosis present

## 2021-07-12 DIAGNOSIS — D500 Iron deficiency anemia secondary to blood loss (chronic): Principal | ICD-10-CM

## 2021-07-12 DIAGNOSIS — K219 Gastro-esophageal reflux disease without esophagitis: Secondary | ICD-10-CM | POA: Diagnosis present

## 2021-07-12 DIAGNOSIS — K5730 Diverticulosis of large intestine without perforation or abscess without bleeding: Secondary | ICD-10-CM | POA: Diagnosis present

## 2021-07-12 DIAGNOSIS — E1169 Type 2 diabetes mellitus with other specified complication: Secondary | ICD-10-CM

## 2021-07-12 DIAGNOSIS — K31819 Angiodysplasia of stomach and duodenum without bleeding: Secondary | ICD-10-CM | POA: Diagnosis present

## 2021-07-12 DIAGNOSIS — Z79899 Other long term (current) drug therapy: Secondary | ICD-10-CM | POA: Diagnosis not present

## 2021-07-12 DIAGNOSIS — K5731 Diverticulosis of large intestine without perforation or abscess with bleeding: Secondary | ICD-10-CM | POA: Diagnosis not present

## 2021-07-12 DIAGNOSIS — K449 Diaphragmatic hernia without obstruction or gangrene: Secondary | ICD-10-CM | POA: Diagnosis present

## 2021-07-12 DIAGNOSIS — K274 Chronic or unspecified peptic ulcer, site unspecified, with hemorrhage: Secondary | ICD-10-CM | POA: Diagnosis not present

## 2021-07-12 DIAGNOSIS — D75839 Thrombocytosis, unspecified: Secondary | ICD-10-CM | POA: Diagnosis present

## 2021-07-12 DIAGNOSIS — Z8719 Personal history of other diseases of the digestive system: Secondary | ICD-10-CM

## 2021-07-12 DIAGNOSIS — E669 Obesity, unspecified: Secondary | ICD-10-CM | POA: Diagnosis present

## 2021-07-12 DIAGNOSIS — R195 Other fecal abnormalities: Secondary | ICD-10-CM | POA: Diagnosis not present

## 2021-07-12 DIAGNOSIS — E785 Hyperlipidemia, unspecified: Secondary | ICD-10-CM | POA: Diagnosis present

## 2021-07-12 DIAGNOSIS — K648 Other hemorrhoids: Secondary | ICD-10-CM | POA: Diagnosis present

## 2021-07-12 DIAGNOSIS — K2970 Gastritis, unspecified, without bleeding: Secondary | ICD-10-CM

## 2021-07-12 DIAGNOSIS — K922 Gastrointestinal hemorrhage, unspecified: Secondary | ICD-10-CM

## 2021-07-12 DIAGNOSIS — Z833 Family history of diabetes mellitus: Secondary | ICD-10-CM

## 2021-07-12 DIAGNOSIS — K259 Gastric ulcer, unspecified as acute or chronic, without hemorrhage or perforation: Secondary | ICD-10-CM | POA: Diagnosis present

## 2021-07-12 DIAGNOSIS — K3189 Other diseases of stomach and duodenum: Secondary | ICD-10-CM | POA: Diagnosis not present

## 2021-07-12 DIAGNOSIS — K5520 Angiodysplasia of colon without hemorrhage: Secondary | ICD-10-CM | POA: Diagnosis present

## 2021-07-12 DIAGNOSIS — K2990 Gastroduodenitis, unspecified, without bleeding: Secondary | ICD-10-CM | POA: Diagnosis present

## 2021-07-12 DIAGNOSIS — D509 Iron deficiency anemia, unspecified: Secondary | ICD-10-CM | POA: Diagnosis not present

## 2021-07-12 DIAGNOSIS — K5521 Angiodysplasia of colon with hemorrhage: Secondary | ICD-10-CM | POA: Diagnosis not present

## 2021-07-12 LAB — ABO/RH: ABO/RH(D): A POS

## 2021-07-12 LAB — PREPARE RBC (CROSSMATCH)

## 2021-07-12 LAB — COMPREHENSIVE METABOLIC PANEL
ALT: 14 U/L (ref 0–44)
AST: 19 U/L (ref 15–41)
Albumin: 3.7 g/dL (ref 3.5–5.0)
Alkaline Phosphatase: 85 U/L (ref 38–126)
Anion gap: 10 (ref 5–15)
BUN: 27 mg/dL — ABNORMAL HIGH (ref 8–23)
CO2: 21 mmol/L — ABNORMAL LOW (ref 22–32)
Calcium: 8.8 mg/dL — ABNORMAL LOW (ref 8.9–10.3)
Chloride: 107 mmol/L (ref 98–111)
Creatinine, Ser: 1.43 mg/dL — ABNORMAL HIGH (ref 0.61–1.24)
GFR, Estimated: 51 mL/min — ABNORMAL LOW (ref >60)
Glucose, Bld: 148 mg/dL — ABNORMAL HIGH (ref 70–99)
Potassium: 3.7 mmol/L (ref 3.5–5.1)
Sodium: 138 mmol/L (ref 135–145)
Total Bilirubin: 0.5 mg/dL (ref 0.3–1.2)
Total Protein: 7.1 g/dL (ref 6.5–8.1)

## 2021-07-12 LAB — CBC WITH DIFFERENTIAL/PLATELET
Abs Immature Granulocytes: 0.04 10*3/uL (ref 0.00–0.07)
Basophils Absolute: 0.1 10*3/uL (ref 0.0–0.1)
Basophils Relative: 1 %
Eosinophils Absolute: 0.2 10*3/uL (ref 0.0–0.5)
Eosinophils Relative: 2 %
HCT: 22.3 % — ABNORMAL LOW (ref 39.0–52.0)
Hemoglobin: 6 g/dL — CL (ref 13.0–17.0)
Immature Granulocytes: 1 %
Lymphocytes Relative: 34 %
Lymphs Abs: 2.6 10*3/uL (ref 0.7–4.0)
MCH: 19.5 pg — ABNORMAL LOW (ref 26.0–34.0)
MCHC: 26.9 g/dL — ABNORMAL LOW (ref 30.0–36.0)
MCV: 72.6 fL — ABNORMAL LOW (ref 80.0–100.0)
Monocytes Absolute: 0.4 10*3/uL (ref 0.1–1.0)
Monocytes Relative: 5 %
Neutro Abs: 4.3 10*3/uL (ref 1.7–7.7)
Neutrophils Relative %: 57 %
Platelets: 575 10*3/uL — ABNORMAL HIGH (ref 150–400)
RBC: 3.07 MIL/uL — ABNORMAL LOW (ref 4.22–5.81)
RDW: 17 % — ABNORMAL HIGH (ref 11.5–15.5)
WBC: 7.6 10*3/uL (ref 4.0–10.5)
nRBC: 0.3 % — ABNORMAL HIGH (ref 0.0–0.2)

## 2021-07-12 LAB — CBG MONITORING, ED: Glucose-Capillary: 110 mg/dL — ABNORMAL HIGH (ref 70–99)

## 2021-07-12 LAB — IRON AND TIBC
Iron: 9 ug/dL — ABNORMAL LOW (ref 45–182)
Saturation Ratios: 2 % — ABNORMAL LOW (ref 17.9–39.5)
TIBC: 442 ug/dL (ref 250–450)
UIBC: 433 ug/dL

## 2021-07-12 LAB — FERRITIN: Ferritin: 6 ng/mL — ABNORMAL LOW (ref 24–336)

## 2021-07-12 MED ORDER — PANTOPRAZOLE SODIUM 40 MG IV SOLR
40.0000 mg | Freq: Two times a day (BID) | INTRAVENOUS | Status: DC
  Administered 2021-07-12 – 2021-07-15 (×5): 40 mg via INTRAVENOUS
  Filled 2021-07-12 (×5): qty 10

## 2021-07-12 MED ORDER — PANTOPRAZOLE SODIUM 40 MG IV SOLR
40.0000 mg | Freq: Once | INTRAVENOUS | Status: AC
  Administered 2021-07-12: 40 mg via INTRAVENOUS
  Filled 2021-07-12: qty 10

## 2021-07-12 MED ORDER — PRAVASTATIN SODIUM 40 MG PO TABS
40.0000 mg | ORAL_TABLET | Freq: Every day | ORAL | Status: DC
  Administered 2021-07-13 – 2021-07-14 (×3): 40 mg via ORAL
  Filled 2021-07-12 (×3): qty 1

## 2021-07-12 MED ORDER — SODIUM CHLORIDE 0.9 % IV SOLN
250.0000 mg | Freq: Once | INTRAVENOUS | Status: DC
  Filled 2021-07-12: qty 20

## 2021-07-12 MED ORDER — METFORMIN HCL 500 MG PO TABS: 500.0000 mg | ORAL_TABLET | Freq: Two times a day (BID) | ORAL | Status: DC

## 2021-07-12 MED ORDER — SODIUM CHLORIDE 0.9 % IV SOLN
10.0000 mL/h | Freq: Once | INTRAVENOUS | Status: AC
  Administered 2021-07-12: 10 mL/h via INTRAVENOUS

## 2021-07-12 MED ORDER — INSULIN ASPART 100 UNIT/ML IJ SOLN
0.0000 [IU] | Freq: Three times a day (TID) | INTRAMUSCULAR | Status: DC
  Administered 2021-07-14 (×2): 2 [IU] via SUBCUTANEOUS
  Administered 2021-07-15: 3 [IU] via SUBCUTANEOUS
  Administered 2021-07-15: 5 [IU] via SUBCUTANEOUS

## 2021-07-12 MED ORDER — HYDRALAZINE HCL 25 MG PO TABS: 25.0000 mg | ORAL_TABLET | Freq: Four times a day (QID) | ORAL | Status: DC | PRN

## 2021-07-12 MED ORDER — FERROUS SULFATE 325 (65 FE) MG PO TABS
325.0000 mg | ORAL_TABLET | Freq: Every day | ORAL | Status: DC
  Administered 2021-07-15: 325 mg via ORAL
  Filled 2021-07-12: qty 1

## 2021-07-12 NOTE — Consult Note (Signed)
? ?                                                                          [facility] Consult: ?4:18 PM ?07/12/2021 ? LOS: 0 days  ? ? ?Referring Provider: ED MD Alvino Chapel  ?Primary Care Physician:  Elenora Fender ?Primary Gastroenterologist:  none.  unassigned  ? ? ? ?Reason for Consultation:  Hgb 6.  Black stool.   ?  ?HPI: Allen Wiggins is a 76 y.o. male.  PMH DM.  Hypertension.  PUD.  HLD.  Morbid obesity, BMI 32.9.  Spinal stenosis. ?  ?Colonoscopy 5 or more years ago at the [place], no records.  Patient reports a polyp and was told he would need procedure in 5 years. ?05/2013 EGD in [place] by Dr. Maura Crandall.  Small HH, several antral erosions.  Treated with Protonix twice daily as well as dicyclomine for possible esophageal spasm. ?Currently takes pantoprazole 40 mg daily.  Denies any active GI symptoms such as dyspepsia, dysphagia, abdominal pain, nausea, anorexia.  Started on oral iron supplements about a month ago by providers at the [place] and subsequently has intermittently seen black stools, has not seen any blood.  Having daily bowel movements.  Months long history of fatigue, dyspnea with moderate activity.  This has not changed in recent weeks.  He works as a Presenter, broadcasting mostly sitting but every couple of hours walks for about 10 or 15 minutes and has been able to do his job.  Denies angina, palpitations.  Had several upper teeth extracted about 6 weeks ago, does not report significant postextraction bleeding.  He took a few of the prescribed ibuprofen for a day or 2 but stopped because he was not having significant pain.  Otherwise does not use NSAIDs. ? ?Went to the [place] today for what sounds like routine lab work.  They called him back and told him he needed to go to local ED and would need blood transfusion.  He came to [facility] ED. ? ?BP 138/62.  Heart rate 69.  RA sats 100%. ?Hgb  6 (9.6 -11.2 on 05/12/2020).  MCV 72.  Platelets 575.  WBCs normal. ?BUN/creatinine 27/1.4.  GFR 51.  Glucose 148 LFTs normal ? ?Does not smoke or chew tobacco.  Does not drink alcohol.  Lives alone, he is widowed. ?Works as a Presenter, broadcasting. ?Denies family history of peptic ulcer disease, GI bleed, anemia, colorectal cancer, gastric cancer ? ?Past Medical History:  ?Diagnosis Date  ? Diabetes mellitus without complication ([place])   ? GERD (gastroesophageal reflux disease)   ? Hypertension   ? PUD (peptic ulcer disease) 05/2013  ? antral erosion on EGD in [facility].  ? ? ?Past Surgical History:  ?Procedure Laterality Date  ? HERNIA REPAIR    ? LEFT HEART CATHETERIZATION WITH CORONARY ANGIOGRAM N/A 05/25/2013  ? Procedure: LEFT HEART CATHETERIZATION WITH CORONARY ANGIOGRAM;  Surgeon: Peter M Martinique, MD;  Location: [facility] CATH LAB;  Service: Cardiovascular;  Laterality: N/A;  ? ? ?Prior to Admission medications   ?Medication Sig Start Date End Date Taking? Authorizing Provider  ?carvedilol (COREG) 12.5 MG tablet Take 6.25 mg by mouth 2 (two) times daily with a meal.  [provider]  ?cholecalciferol (VITAMIN D) 1000 UNITS tablet Take 2,000 Units by mouth daily.    [provider]  ?lisinopril-hydrochlorothiazide (PRINZIDE,ZESTORETIC) 20-12.5 MG per tablet Take 1 tablet by mouth daily.    [provider]  ?metFORMIN (GLUCOPHAGE) 500 MG tablet Take 500 mg by mouth 2 (two) times daily with a meal.    [provider]  ?nitroGLYCERIN (NITROSTAT) 0.4 MG SL tablet Place 1 tablet (0.4 mg total) under the tongue every 5 (five) minutes as needed for chest pain. 05/24/13   Sota Bibles, PA-C  ?pantoprazole (PROTONIX) 40 MG tablet Take 40 mg by mouth daily.    [provider]  ?pravastatin (PRAVACHOL) 40 MG tablet Take 40 mg by mouth at bedtime.    [provider]  ? ? ?Scheduled Meds: ? ? ?Infusions: ? sodium chloride    ? ?PRN Meds: ? ? ? ?Allergies as of 07/12/2021  ? (No Known  Allergies)  ? ? ?Family History  ?Problem Relation Age of Onset  ? Skin cancer Mother   ? Diabetes Mellitus II Father   ? Stroke Sister   ? CAD Neg Hx   ? ? ?Social History  ? ?Socioeconomic History  ? Marital status: Single  ?  Spouse name: Not on file  ? Number of children: Not on file  ? Years of education: Not on file  ? Highest education level: Not on file  ?Occupational History  ? Not on file  ?Tobacco Use  ? Smoking status: Former  ?  Types: Cigarettes  ? Smokeless tobacco: Never  ?Substance and Sexual Activity  ? Alcohol use: Yes  ?  Comment: ocassional  ? Drug use: Not on file  ? Sexual activity: Not Currently  ?Other Topics Concern  ? Not on file  ?Social History Narrative  ? ** Merged History Encounter **  ?    ? ?Social Determinants of Health  ? ?Financial Resource Strain: Not on file  ?Food Insecurity: Not on file  ?Transportation Needs: Not on file  ?Physical Activity: Not on file  ?Stress: Not on file  ?Social Connections: Not on file  ?Intimate Partner Violence: Not on file  ? ? ?REVIEW OF SYSTEMS: ?Constitutional: Fatigue and some weakness with moderate activity.  No symptoms at rest. ?ENT:  No nose bleeds ?Pulm: Shortness of breath with moderate activity.  No cough.  No pleuritic pain. ?CV:  No palpitations, no LE edema.  No angina. ?GU:  No hematuria, no frequency ?GI: See HPI. ?Heme: Denies unusual or excessive bleeding or bruising ?Transfusions: None in the past. ?Neuro:  No headaches.  No syncope.  No seizures.  Endorses numbness in his legs bilaterally, worse on the right.  MD is told him it is due to spinal disease, nerve impingement. ?Derm:  No itching, no rash or sores.  ?Endocrine:  No sweats or chills.  No polyuria or dysuria ?Immunization: Not queried ?Travel: Not queried. ? ? ?PHYSICAL EXAM: ?Vital signs in last 24 hours: ?Vitals:  ? 07/12/21 1535 07/12/21 1545  ?BP: 138/61 114/63  ?Pulse: 62 63  ?Resp: 20 15  ?Temp:    ?SpO2: 100% 100%  ? ?Wt Readings from Last 3 Encounters:  ?07/12/21  113.4 kg  ?05/19/20 79.4 kg  ?05/12/20 79.4 kg  ? ? ?General: Patient is overweight.  Resting comfortably on stretcher in no distress. ?Head: No signs of head trauma or facial asymmetry. ?Eyes: Conjunctiva pale.  No scleral icterus. ?Ears: Perhaps slightly hard of hearing but it is  subtle if present. ?Nose: No congestion or discharge. ?Mouth: No upper teeth.  Many lower teeth also missing but remaining teeth in good repair.  Tongue is midline.  Mucosa is moist, pink, clear. ?Neck: No JVD.  No thyromegaly ?Lungs: Clear bilaterally.  No labored breathing.  No cough. ?Heart: RRR.  No MRG.  S1, S2 present ?Abdomen: Obese, soft without tenderness.  Active bowel sounds.  No HSM, masses, bruits, hernias appreciated.Marland Kitchen   ?Rectal: Did not repeat exam performed by ED staff which revealed "mildly black, not melenic" stool.  This is FOBT positive. ?Musc/Skeltl: No joint gross deformities or swelling. ?Extremities: No CCE. ?Neurologic: Alert and oriented x3.  Moves all 4 limbs.  Good historian.  No gross weakness or tremors ?Skin: No suspicious sores, rashes or lesions on incomplete dermatologic survey ?Nodes: No cervical adenopathy ?Psych: Cooperative, a bit gruff.  Not anxious or agitated. ? ?Intake/Output from previous day: ?No intake/output data recorded. ?Intake/Output this shift: ?No intake/output data recorded. ? ?LAB RESULTS: ?Recent Labs  ?  07/12/21 ?1409  ?WBC 7.6  ?HGB 6.0*  ?HCT 22.3*  ?PLT 575*  ? ?BMET ?Lab Results  ?Component Value Date  ? NA 138 07/12/2021  ? NA 139 05/12/2020  ? NA 137 05/12/2020  ? K 3.7 07/12/2021  ? K 4.1 05/12/2020  ? K 4.2 05/12/2020  ? CL 107 07/12/2021  ? CL 105 05/12/2020  ? CL 105 05/12/2020  ? CO2 21 (L) 07/12/2021  ? CO2 21 (L) 05/12/2020  ? CO2 25 05/25/2013  ? GLUCOSE 148 (H) 07/12/2021  ? GLUCOSE 162 (H) 05/12/2020  ? GLUCOSE 162 (H) 05/12/2020  ? BUN 27 (H) 07/12/2021  ? BUN 26 (H) 05/12/2020  ? BUN 23 05/12/2020  ? CREATININE 1.43 (H) 07/12/2021  ? CREATININE 1.40 (H) 05/12/2020   ? CREATININE 1.49 (H) 05/12/2020  ? CALCIUM 8.8 (L) 07/12/2021  ? CALCIUM 8.8 (L) 05/12/2020  ? CALCIUM 8.9 05/25/2013  ? ?LFT ?Recent Labs  ?  07/12/21 ?1409  ?PROT 7.1  ?ALBUMIN 3.7  ?AST 19  ?AL

## 2021-07-12 NOTE — ED Triage Notes (Signed)
Pt reports told by his primary team to come to ED for blood transfusion due to low hemoglobin. Pt with exertional SOB. Denies CP VSS.  ?

## 2021-07-12 NOTE — H&P (Signed)
?History and Physical  ? ? Allen Wiggins:[id number] DOB: 02-Jun-1945 DOA: 07/12/2021 ? ?PCP: Pcp, No (Confirm with patient/family/NH records and if not entered, this has to be entered at [facility] point of entry) ?Patient coming from: Home ? ?I have personally briefly reviewed patient's old medical records in [facility] ? ?Chief Complaint: Feeling weak ? ?HPI: Allen Wiggins is a 76 y.o. male with medical history significant of HTN, GERD, peptic ulcer status post remote EGD, IIDM, came with worsening of generalized weakness. ? ?Symptoms started about 1 month ago and gradually getting worse, patient also noticed occasional dark-colored stool but no abdominal pain no nauseous vomiting or abdominal pain.  Denies any chest pains shortness of breath.  Denies any NSAIDs use, drink socially. ? ?ED Course: No hypotension no tachycardia.  Hemoglobin 6.0 compared to 11.2 last year. ? ?Patient was ordered 3 unit PRBC, and PPI twice daily. Guaiac positive. ? ?Review of Systems: As per HPI otherwise 14 point review of systems negative.  ? ? ?Past Medical History:  ?Diagnosis Date  ? Diabetes mellitus without complication (HCC)   ? GERD (gastroesophageal reflux disease)   ? Hypertension   ? PUD (peptic ulcer disease) 05/2013  ? antral erosion on EGD in [facility].  ? ? ?Past Surgical History:  ?Procedure Laterality Date  ? HERNIA REPAIR    ? LEFT HEART CATHETERIZATION WITH CORONARY ANGIOGRAM N/A 05/25/2013  ? Procedure: LEFT HEART CATHETERIZATION WITH CORONARY ANGIOGRAM;  Surgeon: Peter M Swaziland, MD;  Location: [facility] CATH LAB;  Service: Cardiovascular;  Laterality: N/A;  ? ? ? reports that he has quit smoking. His smoking use included cigarettes. He has never used smokeless tobacco. He reports current alcohol use. No history on file for drug use. ? ?No Known Allergies ? ?Family History  ?Problem Relation Age of Onset  ? Skin cancer Mother   ? Diabetes Mellitus II Father   ? Stroke Sister   ? CAD Neg Hx   ? ? ? ?Prior to  Admission medications   ?Medication Sig Start Date End Date Taking? Authorizing Provider  ?carvedilol (COREG) 12.5 MG tablet Take 6.25 mg by mouth 2 (two) times daily with a meal.    [provider]  ?cholecalciferol (VITAMIN D) 1000 UNITS tablet Take 2,000 Units by mouth daily.    [provider]  ?lisinopril-hydrochlorothiazide (PRINZIDE,ZESTORETIC) 20-12.5 MG per tablet Take 1 tablet by mouth daily.    [provider]  ?metFORMIN (GLUCOPHAGE) 500 MG tablet Take 500 mg by mouth 2 (two) times daily with a meal.    [provider]  ?nitroGLYCERIN (NITROSTAT) 0.4 MG SL tablet Place 1 tablet (0.4 mg total) under the tongue every 5 (five) minutes as needed for chest pain. 05/24/13   Trixie Dredge, PA-C  ?pantoprazole (PROTONIX) 40 MG tablet Take 40 mg by mouth daily.    [provider]  ?pravastatin (PRAVACHOL) 40 MG tablet Take 40 mg by mouth at bedtime.    [provider]  ? ? ?Physical Exam: ?Vitals:  ? 07/12/21 1647 07/12/21 1700 07/12/21 1715 07/12/21 1724  ?BP: 121/61 110/60 127/76 127/76  ?Pulse: (!) 55 (!) 55 (!) 54 (!) 58  ?Resp: 15 14 14 20   ?Temp: (!) 97.5 ?F (36.4 ?C)   (!) 97.4 ?F (36.3 ?C)  ?TempSrc: Oral   Oral  ?SpO2: 100% 100% 100% 100%  ?Weight:      ?Height:      ? ? ?Constitutional: NAD, calm, comfortable ?Vitals:  ? 07/12/21  1647 07/12/21 1700 07/12/21 1715 07/12/21 1724  ?BP: 121/61 110/60 127/76 127/76  ?Pulse: (!) 55 (!) 55 (!) 54 (!) 58  ?Resp: 15 14 14 20   ?Temp: (!) 97.5 ?F (36.4 ?C)   (!) 97.4 ?F (36.3 ?C)  ?TempSrc: Oral   Oral  ?SpO2: 100% 100% 100% 100%  ?Weight:      ?Height:      ? ?Eyes: PERRL, lids and conjunctivae normal ?ENMT: Mucous membranes are moist. Posterior pharynx clear of any exudate or lesions.Normal dentition.  ?Neck: normal, supple, no masses, no thyromegaly ?Respiratory: clear to auscultation bilaterally, no wheezing, no crackles. Normal respiratory effort. No accessory muscle use.  ?Cardiovascular: Regular rate and  rhythm, no murmurs / rubs / gallops. No extremity edema. 2+ pedal pulses. No carotid bruits.  ?Abdomen: no tenderness, no masses palpated. No hepatosplenomegaly. Bowel sounds positive.  ?Musculoskeletal: no clubbing / cyanosis. No joint deformity upper and lower extremities. Good ROM, no contractures. Normal muscle tone.  ?Skin: no rashes, lesions, ulcers. No induration ?Neurologic: CN 2-12 grossly intact. Sensation intact, DTR normal. Strength 5/5 in all 4.  ?Psychiatric: Normal judgment and insight. Alert and oriented x 3. Normal mood.  ? ? ? ?Labs on Admission: I have personally reviewed following labs and imaging studies ? ?CBC: ?Recent Labs  ?Lab 07/12/21 ?1409  ?WBC 7.6  ?NEUTROABS 4.3  ?HGB 6.0*  ?HCT 22.3*  ?MCV 72.6*  ?PLT 575*  ? ?Basic Metabolic Panel: ?Recent Labs  ?Lab 07/12/21 ?1409  ?NA 138  ?K 3.7  ?CL 107  ?CO2 21*  ?GLUCOSE 148*  ?BUN 27*  ?CREATININE 1.43*  ?CALCIUM 8.8*  ? ?GFR: ?Estimated Creatinine Clearance: 58 mL/min (A) (by C-G formula based on SCr of 1.43 mg/dL (H)). ?Liver Function Tests: ?Recent Labs  ?Lab 07/12/21 ?1409  ?AST 19  ?ALT 14  ?ALKPHOS 85  ?BILITOT 0.5  ?PROT 7.1  ?ALBUMIN 3.7  ? ?No results for input(s): LIPASE, AMYLASE in the last 168 hours. ?No results for input(s): AMMONIA in the last 168 hours. ?Coagulation Profile: ?No results for input(s): INR, PROTIME in the last 168 hours. ?Cardiac Enzymes: ?No results for input(s): CKTOTAL, CKMB, CKMBINDEX, TROPONINI in the last 168 hours. ?BNP (last 3 results) ?No results for input(s): PROBNP in the last 8760 hours. ?HbA1C: ?No results for input(s): HGBA1C in the last 72 hours. ?CBG: ?No results for input(s): GLUCAP in the last 168 hours. ?Lipid Profile: ?No results for input(s): CHOL, HDL, LDLCALC, TRIG, CHOLHDL, LDLDIRECT in the last 72 hours. ?Thyroid Function Tests: ?No results for input(s): TSH, T4TOTAL, FREET4, T3FREE, THYROIDAB in the last 72 hours. ?Anemia Panel: ?Recent Labs  ?  07/12/21 ?1554  ?FERRITIN 6*  ?TIBC 442   ?IRON 9*  ? ?Urine analysis: ?   ?Component Value Date/Time  ? COLORURINE YELLOW 05/12/2020 2040  ? APPEARANCEUR CLEAR 05/12/2020 2040  ? LABSPEC 1.044 (H) 05/12/2020 2040  ? PHURINE 5.0 05/12/2020 2040  ? GLUCOSEU 50 (A) 05/12/2020 2040  ? HGBUR MODERATE (A) 05/12/2020 2040  ? BILIRUBINUR NEGATIVE 05/12/2020 2040  ? KETONESUR NEGATIVE 05/12/2020 2040  ? PROTEINUR NEGATIVE 05/12/2020 2040  ? UROBILINOGEN 2.0 (H) 05/26/2013 0046  ? NITRITE NEGATIVE 05/12/2020 2040  ? LEUKOCYTESUR NEGATIVE 05/12/2020 2040  ? ? ?Radiological Exams on Admission: ?No results found. ? ?EKG: Independently reviewed. Sinus, no acute ST changes ? ?Assessment/Plan ?Principal Problem: ?  GI bleed ?Active Problems: ?  Blood loss anemia ? (please populate well all problems here in Problem List. (For example, if patient is  on BP meds at home and you resume or decide to hold them, it is a problem that needs to be her. Same for CAD, COPD, HLD and so on) ? ?Acute on chronic iron deficiency anemia, symptomatic ?-Likely secondary to chronic bleeding peptic ulcer ?-Continue PPI twice daily ?-EGD tomorrow ?-PRBC x3, recheck CBC tomorrow. ?-IV iron x1, start p.o. iron supplement tomorrow. ? ?HTN ?-Hold home BP meds ?-PRN Hydralazine for now. ? ?IIDM ?-Continue Metformin ?-Add sliding scale. ? ?Thrombocytosis ?-Probably reactive to anemia, correct anemia and then reevaluate. ? ?DVT prophylaxis: SCD ?Code Status: Full code ?Family Communication: None at bedside ?Disposition Plan: Patient came with severe symptomatic anemia secondary to GI bleed, requiring PRBC transfusion and EGD inpatient, expect more than 2 midnight hospital stay. ?Consults called: [place] ?Admission status: Tele admit ? ? ?Emeline GeneralPing T Jerron Niblack MD ?[facility] ?Pager [phone] ? ?07/12/2021, 5:48 PM  ?  ?

## 2021-07-12 NOTE — ED Provider Notes (Signed)
?[facility] EMERGENCY DEPARTMENT ?Provider Note ? ? ?CSN: [id number] ?Arrival date & time: 07/12/21  1333 ? ?  ? ?History ? ?Chief Complaint  ?Patient presents with  ? Abnormal Lab  ? Shortness of Breath  ? ? ?Allen Wiggins is a 76 y.o. male. ? ? ?Abnormal Lab ?Shortness of Breath ?Associated symptoms: no chest pain   ?Patient with shortness of breath and fatigue.  Sent in from the [place] with an anemia.  Does not, the paperwork about it though.  States he will occasionally have black stool but states that was just from the iron he would take.  Does not know that he has a history of anemia.  States has been feeling more short of breath however over the last 3 years.  He states he has had an esophageal ulcer in the past.  Has not noticed any blood in vomit.  States he would not be able to walk to the end of the hallway without having to take a break. ?  ?Past Medical History:  ?Diagnosis Date  ? Diabetes mellitus without complication (HCC)   ? GERD (gastroesophageal reflux disease)   ? Hypertension   ? PUD (peptic ulcer disease) 05/2013  ? antral erosion on EGD in [facility].  ? ? ?Home Medications ?Prior to Admission medications   ?Medication Sig Start Date End Date Taking? Authorizing Provider  ?carvedilol (COREG) 12.5 MG tablet Take 6.25 mg by mouth 2 (two) times daily with a meal.    [provider]  ?cholecalciferol (VITAMIN D) 1000 UNITS tablet Take 2,000 Units by mouth daily.    [provider]  ?lisinopril-hydrochlorothiazide (PRINZIDE,ZESTORETIC) 20-12.5 MG per tablet Take 1 tablet by mouth daily.    [provider]  ?metFORMIN (GLUCOPHAGE) 500 MG tablet Take 500 mg by mouth 2 (two) times daily with a meal.    [provider]  ?nitroGLYCERIN (NITROSTAT) 0.4 MG SL tablet Place 1 tablet (0.4 mg total) under the tongue every 5 (five) minutes as needed for chest pain. 05/24/13   Trixie Dredge, PA-C  ?pantoprazole (PROTONIX) 40 MG tablet Take 40 mg by mouth daily.     [provider]  ?pravastatin (PRAVACHOL) 40 MG tablet Take 40 mg by mouth at bedtime.    [provider]  ?   ? ?Allergies    ?Patient has no known allergies.   ? ?Review of Systems   ?Review of Systems  ?Constitutional:  Negative for appetite change.  ?Respiratory:  Positive for shortness of breath.   ?Cardiovascular:  Negative for chest pain and leg swelling.  ?Gastrointestinal:  Negative for blood in stool.  ?Musculoskeletal:  Negative for back pain.  ?Neurological:  Negative for weakness.  ? ?Physical Exam ?Updated Vital Signs ?BP 138/61 (BP Location: Left Arm)   Pulse 62   Temp 98.9 ?F (37.2 ?C) (Oral)   Resp 20   Ht 6\' 1"  (1.854 m)   Wt 113.4 kg   SpO2 100%   BMI 32.98 kg/m?  ?Physical Exam ?Vitals and nursing note reviewed.  ?Cardiovascular:  ?   Rate and Rhythm: Normal rate.  ?Pulmonary:  ?   Breath sounds: Normal breath sounds.  ?Chest:  ?   Chest wall: No tenderness.  ?Abdominal:  ?   Tenderness: There is no abdominal tenderness.  ?Genitourinary: ?   Rectum: Guaiac result positive.  ?   Comments: Mildly black but not melanotic stool. ?Skin: ?   General: Skin is warm.  ?   Capillary  Refill: Capillary refill takes less than 2 seconds.  ?Neurological:  ?   Mental Status: He is alert and oriented to person, place, and time.  ? ? ?ED Results / Procedures / Treatments   ?Labs ?(all labs ordered are listed, but only abnormal results are displayed) ?Labs Reviewed  ?COMPREHENSIVE METABOLIC PANEL - Abnormal; Notable for the following components:  ?    Result Value  ? CO2 21 (*)   ? Glucose, Bld 148 (*)   ? BUN 27 (*)   ? Creatinine, Ser 1.43 (*)   ? Calcium 8.8 (*)   ? GFR, Estimated 51 (*)   ? All other components within normal limits  ?CBC WITH DIFFERENTIAL/PLATELET - Abnormal; Notable for the following components:  ? RBC 3.07 (*)   ? Hemoglobin 6.0 (*)   ? HCT 22.3 (*)   ? MCV 72.6 (*)   ? MCH 19.5 (*)   ? MCHC 26.9 (*)   ? RDW 17.0 (*)   ? Platelets 575 (*)   ? nRBC 0.3 (*)   ? All  other components within normal limits  ?IRON AND TIBC  ?FERRITIN  ?POC OCCULT BLOOD, ED  ?TYPE AND SCREEN  ?ABO/RH  ?PREPARE RBC (CROSSMATCH)  ? ? ?EKG ?None ? ?Radiology ?No results found. ? ?Procedures ?Procedures  ? ? ?Medications Ordered in ED ?Medications  ?0.9 %  sodium chloride infusion (has no administration in time range)  ?pantoprazole (PROTONIX) injection 40 mg (40 mg Intravenous Given 07/12/21 1602)  ? ? ?ED Course/ Medical Decision Making/ A&P ?Clinical Course as of 07/12/21 1605  ?Wed Jul 12, 2021  ?1445 Hgb 6.0 [LM]  ?  ?Clinical Course User Index ?[LM] Darrick Grinder, PA-C  ? ?                        ?Medical Decision Making ?Amount and/or Complexity of Data Reviewed ?External Data Reviewed: labs. ?Labs: ordered. ?ECG/medicine tests: independent interpretation performed. ? ?Risk ?Prescription drug management. ?Decision regarding hospitalization. ? ? ?Patient presents with reported anemia.  Found to have a hemoglobin of 6 here.  Is microcytic.  Likely iron deficient.  Iron studies been ordered.  However patient is guaiac positive.  No flank blood.  Vitals reassuring with normal pulse rate and maintain blood pressure but patient is clearly symptomatic and states he cannot walk down the hall without having to take a break.  Likely has been going on for a while.  We will transfuse 3 units.  Discussed with [facility] who will see patient in the ER.  Patient states he has not eaten today.  Upper GI bleed felt more likely.  Has had esophageal ulcer in the past. ? ?CRITICAL CARE ?Performed by: Benjiman Core ?Total critical care time: 30 minutes ?Critical care time was exclusive of separately billable procedures and treating other patients. ?Critical care was necessary to treat or prevent imminent or life-threatening deterioration. ?Critical care was time spent personally by me on the following activities: development of treatment plan with patient and/or surrogate as well as nursing, discussions with  consultants, evaluation of patient's response to treatment, examination of patient, obtaining history from patient or surrogate, ordering and performing treatments and interventions, ordering and review of laboratory studies, ordering and review of radiographic studies, pulse oximetry and re-evaluation of patient's condition. ? ? ? ? ? ? ? ? ?Final Clinical Impression(s) / ED Diagnoses ?Final diagnoses:  ?Iron deficiency anemia due to chronic blood loss  ? ? ?  Rx / DC Orders ?ED Discharge Orders   ? ? None  ? ?  ? ? ?  ?Benjiman CorePickering, Jenesa Foresta, MD ?07/12/21 1605 ? ?

## 2021-07-12 NOTE — ED Provider Triage Note (Signed)
Emergency Medicine Provider Triage Evaluation Note ? ?JAYKOB MINICHIELLO , a 76 y.o. male  was evaluated in triage.  Pt complains of abnormal hemoglobin level with exertional shortness of breath.  Patient had a physical at the [place] in [place] and was contacted, told that his hemoglobin was critically low, recommended going to the emergency department for possible blood transfusion.  Patient is unsure about the hemoglobin level.  No records available in care everywhere.  Patient denies chest pain, abdominal pain, headache.  Endorses shortness of breath with exertion ? ?Review of Systems  ?Positive: Shortness of breath with exertion ?Negative: Chest pain, generalized weakness ? ?Physical Exam  ?BP 138/62 (BP Location: Left Arm)   Pulse 69   Temp 98.9 ?F (37.2 ?C) (Oral)   Resp 18   Ht 6\' 1"  (1.854 m)   Wt 113.4 kg   SpO2 100%   BMI 32.98 kg/m?  ?Gen:   Awake, no distress   ?Resp:  Normal effort  ?MSK:   Moves extremities without difficulty  ?Other:   ? ?Medical Decision Making  ?Medically screening exam initiated at 1:52 PM.  Appropriate orders placed.  QUANDARIUS NILL was informed that the remainder of the evaluation will be completed by another provider, this initial triage assessment does not replace that evaluation, and the importance of remaining in the ED until their evaluation is complete. ? ? ?  ?Edwin Dada, PA-C ?07/12/21 1353 ? ?

## 2021-07-12 NOTE — H&P (View-Only) (Signed)
? ?                                                                          [facility] Consult: ?4:18 PM ?07/12/2021 ? LOS: 0 days  ? ? ?Referring Provider: ED MD Alvino Chapel  ?Primary Care Physician:  Elenora Fender ?Primary Gastroenterologist:  none.  unassigned  ? ? ? ?Reason for Consultation:  Hgb 6.  Black stool.   ?  ?HPI: Allen Wiggins is a 76 y.o. male.  PMH DM.  Hypertension.  PUD.  HLD.  Morbid obesity, BMI 32.9.  Spinal stenosis. ?  ?Colonoscopy 5 or more years ago at the [place], no records.  Patient reports a polyp and was told he would need procedure in 5 years. ?05/2013 EGD in [place] by Dr. Maura Crandall.  Small HH, several antral erosions.  Treated with Protonix twice daily as well as dicyclomine for possible esophageal spasm. ?Currently takes pantoprazole 40 mg daily.  Denies any active GI symptoms such as dyspepsia, dysphagia, abdominal pain, nausea, anorexia.  Started on oral iron supplements about a month ago by providers at the [place] and subsequently has intermittently seen black stools, has not seen any blood.  Having daily bowel movements.  Months long history of fatigue, dyspnea with moderate activity.  This has not changed in recent weeks.  He works as a Presenter, broadcasting mostly sitting but every couple of hours walks for about 10 or 15 minutes and has been able to do his job.  Denies angina, palpitations.  Had several upper teeth extracted about 6 weeks ago, does not report significant postextraction bleeding.  He took a few of the prescribed ibuprofen for a day or 2 but stopped because he was not having significant pain.  Otherwise does not use NSAIDs. ? ?Went to the [place] today for what sounds like routine lab work.  They called him back and told him he needed to go to local ED and would need blood transfusion.  He came to [facility] ED. ? ?BP 138/62.  Heart rate 69.  RA sats 100%. ?Hgb  6 (9.6 -11.2 on 05/12/2020).  MCV 72.  Platelets 575.  WBCs normal. ?BUN/creatinine 27/1.4.  GFR 51.  Glucose 148 LFTs normal ? ?Does not smoke or chew tobacco.  Does not drink alcohol.  Lives alone, he is widowed. ?Works as a Presenter, broadcasting. ?Denies family history of peptic ulcer disease, GI bleed, anemia, colorectal cancer, gastric cancer ? ?Past Medical History:  ?Diagnosis Date  ? Diabetes mellitus without complication ([place])   ? GERD (gastroesophageal reflux disease)   ? Hypertension   ? PUD (peptic ulcer disease) 05/2013  ? antral erosion on EGD in [facility].  ? ? ?Past Surgical History:  ?Procedure Laterality Date  ? HERNIA REPAIR    ? LEFT HEART CATHETERIZATION WITH CORONARY ANGIOGRAM N/A 05/25/2013  ? Procedure: LEFT HEART CATHETERIZATION WITH CORONARY ANGIOGRAM;  Surgeon: Peter M Martinique, MD;  Location: [facility] C[facility] LAB;  Service: Cardiovascular;  Laterality: N/A;  ? ? ?Prior to Admission medications   ?Medication Sig Start Date End Date Taking? Authorizing Provider  ?carvedilol (COREG) 12.5 MG tablet Take 6.25 mg by mouth 2 (two) times daily with a meal.  [provider]  ?cholecalciferol (VITAMIN D) 1000 UNITS tablet Take 2,000 Units by mouth daily.    [provider]  ?lisinopril-hydrochlorothiazide (PRINZIDE,ZESTORETIC) 20-12.5 MG per tablet Take 1 tablet by mouth daily.    [provider]  ?metFORMIN (GLUCOPHAGE) 500 MG tablet Take 500 mg by mouth 2 (two) times daily with a meal.    [provider]  ?nitroGLYCERIN (NITROSTAT) 0.4 MG SL tablet Place 1 tablet (0.4 mg total) under the tongue every 5 (five) minutes as needed for chest pain. 05/24/13   Yamir Bibles, PA-C  ?pantoprazole (PROTONIX) 40 MG tablet Take 40 mg by mouth daily.    [provider]  ?pravastatin (PRAVACHOL) 40 MG tablet Take 40 mg by mouth at bedtime.    [provider]  ? ? ?Scheduled Meds: ? ? ?Infusions: ? sodium chloride    ? ?PRN Meds: ? ? ? ?Allergies as of 07/12/2021  ? (No Known  Allergies)  ? ? ?Family History  ?Problem Relation Age of Onset  ? Skin cancer Mother   ? Diabetes Mellitus II Father   ? Stroke Sister   ? CAD Neg Hx   ? ? ?Social History  ? ?Socioeconomic History  ? Marital status: Single  ?  Spouse name: Not on file  ? Number of children: Not on file  ? Years of education: Not on file  ? Highest education level: Not on file  ?Occupational History  ? Not on file  ?Tobacco Use  ? Smoking status: Former  ?  Types: Cigarettes  ? Smokeless tobacco: Never  ?Substance and Sexual Activity  ? Alcohol use: Yes  ?  Comment: ocassional  ? Drug use: Not on file  ? Sexual activity: Not Currently  ?Other Topics Concern  ? Not on file  ?Social History Narrative  ? ** Merged History Encounter **  ?    ? ?Social Determinants of Health  ? ?Financial Resource Strain: Not on file  ?Food Insecurity: Not on file  ?Transportation Needs: Not on file  ?Physical Activity: Not on file  ?Stress: Not on file  ?Social Connections: Not on file  ?Intimate Partner Violence: Not on file  ? ? ?REVIEW OF SYSTEMS: ?Constitutional: Fatigue and some weakness with moderate activity.  No symptoms at rest. ?ENT:  No nose bleeds ?Pulm: Shortness of breath with moderate activity.  No cough.  No pleuritic pain. ?CV:  No palpitations, no LE edema.  No angina. ?GU:  No hematuria, no frequency ?GI: See HPI. ?Heme: Denies unusual or excessive bleeding or bruising ?Transfusions: None in the past. ?Neuro:  No headaches.  No syncope.  No seizures.  Endorses numbness in his legs bilaterally, worse on the right.  MD is told him it is due to spinal disease, nerve impingement. ?Derm:  No itching, no rash or sores.  ?Endocrine:  No sweats or chills.  No polyuria or dysuria ?Immunization: Not queried ?Travel: Not queried. ? ? ?PHYSICAL EXAM: ?Vital signs in last 24 hours: ?Vitals:  ? 07/12/21 1535 07/12/21 1545  ?BP: 138/61 114/63  ?Pulse: 62 63  ?Resp: 20 15  ?Temp:    ?SpO2: 100% 100%  ? ?Wt Readings from Last 3 Encounters:  ?07/12/21  113.4 kg  ?05/19/20 79.4 kg  ?05/12/20 79.4 kg  ? ? ?General: Patient is overweight.  Resting comfortably on stretcher in no distress. ?Head: No signs of head trauma or facial asymmetry. ?Eyes: Conjunctiva pale.  No scleral icterus. ?Ears: Perhaps slightly hard of hearing but it is  subtle if present. ?Nose: No congestion or discharge. ?Mouth: No upper teeth.  Many lower teeth also missing but remaining teeth in good repair.  Tongue is midline.  Mucosa is moist, pink, clear. ?Neck: No JVD.  No thyromegaly ?Lungs: Clear bilaterally.  No labored breathing.  No cough. ?Heart: RRR.  No MRG.  S1, S2 present ?Abdomen: Obese, soft without tenderness.  Active bowel sounds.  No HSM, masses, bruits, hernias appreciated.Marland Kitchen   ?Rectal: Did not repeat exam performed by ED staff which revealed "mildly black, not melenic" stool.  This is FOBT positive. ?Musc/Skeltl: No joint gross deformities or swelling. ?Extremities: No CCE. ?Neurologic: Alert and oriented x3.  Moves all 4 limbs.  Good historian.  No gross weakness or tremors ?Skin: No suspicious sores, rashes or lesions on incomplete dermatologic survey ?Nodes: No cervical adenopathy ?Psych: Cooperative, a bit gruff.  Not anxious or agitated. ? ?Intake/Output from previous day: ?No intake/output data recorded. ?Intake/Output this shift: ?No intake/output data recorded. ? ?LAB RESULTS: ?Recent Labs  ?  07/12/21 ?1409  ?WBC 7.6  ?HGB 6.0*  ?HCT 22.3*  ?PLT 575*  ? ?BMET ?Lab Results  ?Component Value Date  ? NA 138 07/12/2021  ? NA 139 05/12/2020  ? NA 137 05/12/2020  ? K 3.7 07/12/2021  ? K 4.1 05/12/2020  ? K 4.2 05/12/2020  ? CL 107 07/12/2021  ? CL 105 05/12/2020  ? CL 105 05/12/2020  ? CO2 21 (L) 07/12/2021  ? CO2 21 (L) 05/12/2020  ? CO2 25 05/25/2013  ? GLUCOSE 148 (H) 07/12/2021  ? GLUCOSE 162 (H) 05/12/2020  ? GLUCOSE 162 (H) 05/12/2020  ? BUN 27 (H) 07/12/2021  ? BUN 26 (H) 05/12/2020  ? BUN 23 05/12/2020  ? CREATININE 1.43 (H) 07/12/2021  ? CREATININE 1.40 (H) 05/12/2020   ? CREATININE 1.49 (H) 05/12/2020  ? CALCIUM 8.8 (L) 07/12/2021  ? CALCIUM 8.8 (L) 05/12/2020  ? CALCIUM 8.9 05/25/2013  ? ?LFT ?Recent Labs  ?  07/12/21 ?1409  ?PROT 7.1  ?ALBUMIN 3.7  ?AST 19  ?AL

## 2021-07-13 ENCOUNTER — Encounter (HOSPITAL_COMMUNITY)
Admission: EM | Disposition: A | Discharge status: Home / Self Care | Payer: Self-pay | Source: Home / Self Care | Attending: Internal Medicine

## 2021-07-13 ENCOUNTER — Inpatient Hospital Stay (HOSPITAL_COMMUNITY): Payer: No Typology Code available for payment source | Admitting: Anesthesiology

## 2021-07-13 ENCOUNTER — Encounter (HOSPITAL_COMMUNITY): Payer: Self-pay | Admitting: Internal Medicine

## 2021-07-13 DIAGNOSIS — K31819 Angiodysplasia of stomach and duodenum without bleeding: Secondary | ICD-10-CM

## 2021-07-13 DIAGNOSIS — K259 Gastric ulcer, unspecified as acute or chronic, without hemorrhage or perforation: Secondary | ICD-10-CM

## 2021-07-13 DIAGNOSIS — K3189 Other diseases of stomach and duodenum: Secondary | ICD-10-CM

## 2021-07-13 DIAGNOSIS — K2990 Gastroduodenitis, unspecified, without bleeding: Secondary | ICD-10-CM

## 2021-07-13 DIAGNOSIS — K5520 Angiodysplasia of colon without hemorrhage: Secondary | ICD-10-CM

## 2021-07-13 DIAGNOSIS — K2289 Other specified disease of esophagus: Secondary | ICD-10-CM

## 2021-07-13 DIAGNOSIS — K31811 Angiodysplasia of stomach and duodenum with bleeding: Secondary | ICD-10-CM

## 2021-07-13 DIAGNOSIS — D509 Iron deficiency anemia, unspecified: Secondary | ICD-10-CM

## 2021-07-13 DIAGNOSIS — R195 Other fecal abnormalities: Secondary | ICD-10-CM

## 2021-07-13 DIAGNOSIS — K449 Diaphragmatic hernia without obstruction or gangrene: Secondary | ICD-10-CM

## 2021-07-13 DIAGNOSIS — K2970 Gastritis, unspecified, without bleeding: Secondary | ICD-10-CM

## 2021-07-13 HISTORY — PX: HOT HEMOSTASIS: SHX5433

## 2021-07-13 HISTORY — PX: BIOPSY: SHX5522

## 2021-07-13 HISTORY — PX: ESOPHAGOGASTRODUODENOSCOPY (EGD) WITH PROPOFOL: SHX5813

## 2021-07-13 LAB — CBG MONITORING, ED
Glucose-Capillary: 95 mg/dL (ref 70–99)
Glucose-Capillary: 87 mg/dL (ref 70–99)
Glucose-Capillary: 93 mg/dL (ref 70–99)

## 2021-07-13 LAB — CBC
HCT: 24.2 % — ABNORMAL LOW (ref 39.0–52.0)
HCT: 28 % — ABNORMAL LOW (ref 39.0–52.0)
Hemoglobin: 6.9 g/dL — CL (ref 13.0–17.0)
Hemoglobin: 8 g/dL — ABNORMAL LOW (ref 13.0–17.0)
MCH: 20.8 pg — ABNORMAL LOW (ref 26.0–34.0)
MCH: 21.7 pg — ABNORMAL LOW (ref 26.0–34.0)
MCHC: 28.5 g/dL — ABNORMAL LOW (ref 30.0–36.0)
MCHC: 28.6 g/dL — ABNORMAL LOW (ref 30.0–36.0)
MCV: 73.1 fL — ABNORMAL LOW (ref 80.0–100.0)
MCV: 76.1 fL — ABNORMAL LOW (ref 80.0–100.0)
Platelets: 508 10*3/uL — ABNORMAL HIGH (ref 150–400)
Platelets: 465 10*3/uL — ABNORMAL HIGH (ref 150–400)
RBC: 3.31 MIL/uL — ABNORMAL LOW (ref 4.22–5.81)
RBC: 3.68 MIL/uL — ABNORMAL LOW (ref 4.22–5.81)
RDW: 17.9 % — ABNORMAL HIGH (ref 11.5–15.5)
RDW: 18.3 % — ABNORMAL HIGH (ref 11.5–15.5)
WBC: 7.2 10*3/uL (ref 4.0–10.5)
WBC: 6.7 10*3/uL (ref 4.0–10.5)
nRBC: 0 % (ref 0.0–0.2)
nRBC: 0 % (ref 0.0–0.2)

## 2021-07-13 LAB — GLUCOSE, CAPILLARY
Glucose-Capillary: 104 mg/dL — ABNORMAL HIGH (ref 70–99)
Glucose-Capillary: 108 mg/dL — ABNORMAL HIGH (ref 70–99)

## 2021-07-13 SURGERY — ESOPHAGOGASTRODUODENOSCOPY (EGD) WITH PROPOFOL
Anesthesia: Monitor Anesthesia Care

## 2021-07-13 MED ORDER — PEG-KCL-NACL-NASULF-NA ASC-C 100 G PO SOLR
0.5000 | Freq: Once | ORAL | Status: AC
  Administered 2021-07-13: 100 g via ORAL

## 2021-07-13 MED ORDER — METFORMIN HCL 500 MG PO TABS: 500.0000 mg | ORAL_TABLET | Freq: Two times a day (BID) | ORAL | Status: DC

## 2021-07-13 MED ORDER — BISACODYL 5 MG PO TBEC
10.0000 mg | DELAYED_RELEASE_TABLET | Freq: Once | ORAL | Status: AC
  Administered 2021-07-13: 10 mg via ORAL
  Filled 2021-07-13: qty 2

## 2021-07-13 MED ORDER — LIDOCAINE 2% (20 MG/ML) 5 ML SYRINGE
INTRAMUSCULAR | Status: DC | PRN
  Administered 2021-07-13: 50 mg via INTRAVENOUS

## 2021-07-13 MED ORDER — METFORMIN HCL 500 MG PO TABS
500.0000 mg | ORAL_TABLET | Freq: Two times a day (BID) | ORAL | Status: DC
  Filled 2021-07-13 (×2): qty 1

## 2021-07-13 MED ORDER — PEG-KCL-NACL-NASULF-NA ASC-C 100 G PO SOLR
0.5000 | Freq: Once | ORAL | Status: AC
  Administered 2021-07-13: 100 g via ORAL
  Filled 2021-07-13: qty 1

## 2021-07-13 MED ORDER — PEG-KCL-NACL-NASULF-NA ASC-C 100 G PO SOLR: 1.0000 | Freq: Once | ORAL | Status: DC

## 2021-07-13 MED ORDER — PROPOFOL 500 MG/50ML IV EMUL
INTRAVENOUS | Status: DC | PRN
Start: 2021-07-13 — End: 2021-07-13
  Administered 2021-07-13: 125 ug/kg/min via INTRAVENOUS

## 2021-07-13 MED ORDER — LACTATED RINGERS IV SOLN: INTRAVENOUS | Status: DC

## 2021-07-13 MED ORDER — PROPOFOL 10 MG/ML IV BOLUS
INTRAVENOUS | Status: DC | PRN
  Administered 2021-07-13 (×2): 20 mg via INTRAVENOUS

## 2021-07-13 SURGICAL SUPPLY — 15 items

## 2021-07-13 NOTE — Anesthesia Postprocedure Evaluation (Signed)
Anesthesia Post Note ? ?Patient: Allen Wiggins ? ?Procedure(s) Performed: ESOPHAGOGASTRODUODENOSCOPY (EGD) WITH PROPOFOL ?HOT HEMOSTASIS (ARGON PLASMA COAGULATION/BICAP) ?BIOPSY ? ?  ? ?Patient location during evaluation: PACU ?Anesthesia Type: MAC ?Level of consciousness: awake and alert ?Pain management: pain level controlled ?Vital Signs Assessment: post-procedure vital signs reviewed and stable ?Respiratory status: spontaneous breathing, nonlabored ventilation and respiratory function stable ?Cardiovascular status: blood pressure returned to baseline and stable ?Postop Assessment: no apparent nausea or vomiting ?Anesthetic complications: no ? ? ?No notable events documented. ? ?Last Vitals:  ?Vitals:  ? 07/13/21 0942 07/13/21 0957  ?BP: 123/66 (!) 143/73  ?Pulse: (!) 52 (!) 48  ?Resp: 11 12  ?Temp: 36.6 ?C 36.6 ?C  ?SpO2: 99% 100%  ?  ?Last Pain:  ?Vitals:  ? 07/13/21 0806  ?TempSrc: Oral  ?PainSc: 0-No pain  ? ? ?  ?  ?  ?  ?  ?  ? ?Lynda Rainwater ? ? ? ? ?

## 2021-07-13 NOTE — Interval H&P Note (Signed)
History and Physical Interval Note: Patient stable overnight. Tolerated transfusion well and feeling improved. I have discussed EGD with him - risks of it and anesthesia, he wants to proceed. All questions answered. If we find a cause for IDA on this exam we will address it. If the exam is normal he will need a colonoscopy. He understands and agrees. Otherwise denies complaints this AM - no cardiopulmonary symptoms ? ?07/13/2021 ?8:54 AM ? ?Allen Wiggins  has presented today for surgery, with the diagnosis of Microcytic anemia, acute.  Black stool..  The various methods of treatment have been discussed with the patient and family. After consideration of risks, benefits and other options for treatment, the patient has consented to  Procedure(s): ?ESOPHAGOGASTRODUODENOSCOPY (EGD) WITH PROPOFOL (N/A) as a surgical intervention.  The patient's history has been reviewed, patient examined, no change in status, stable for surgery.  I have reviewed the patient's chart and labs.  Questions were answered to the patient's satisfaction.   ? ? ?Willaim Rayas Kashmere Daywalt ? ? ?

## 2021-07-13 NOTE — Transfer of Care (Signed)
Immediate Anesthesia Transfer of Care Note ? ?Patient: Allen Wiggins ? ?Procedure(s) Performed: ESOPHAGOGASTRODUODENOSCOPY (EGD) WITH PROPOFOL ?HOT HEMOSTASIS (ARGON PLASMA COAGULATION/BICAP) ?BIOPSY ? ?Patient Location: PACU ? ?Anesthesia Type:MAC ? ?Level of Consciousness: awake, alert  and oriented ? ?Airway & Oxygen Therapy: Patient Spontanous Breathing and Patient connected to nasal cannula oxygen ? ?Post-op Assessment: Report given to RN, Post -op Vital signs reviewed and stable and Patient moving all extremities X 4 ? ?Post vital signs: Reviewed and stable ? ?Last Vitals:  ?Vitals Value Taken Time  ?BP 123/66 07/13/21 0942  ?Temp    ?Pulse 53 07/13/21 0942  ?Resp 12 07/13/21 0942  ?SpO2 100 % 07/13/21 0942  ?Vitals shown include unvalidated device data. ? ?Last Pain:  ?Vitals:  ? 07/13/21 0806  ?TempSrc: Oral  ?PainSc: 0-No pain  ?   ? ?  ? ?Complications: No notable events documented. ?

## 2021-07-13 NOTE — Progress Notes (Signed)
?  Progress Note ? ? ?Patient: Allen Wiggins NKN:[id number] DOB: 01/05/1946 DOA: 07/12/2021     1 ?DOS: the patient was seen and examined on 07/13/2021 ?  ?Brief hospital course: ?76 y.o. male with medical history significant of HTN, GERD, peptic ulcer status post remote EGD, IIDM, came with worsening of generalized weakness. ?  ?Symptoms started about 1 month ago and gradually getting worse, patient also noticed occasional dark-colored stool but no abdominal pain no nauseous vomiting or abdominal pain ? ?Assessment and Plan: ?No notes have been filed under this hospital service. ?Service: Hospitalist ? ?Acute on chronic iron deficiency anemia, symptomatic ?-Suspected chronic bleeding peptic ulcer ?-Continued on PPI twice daily ?-Underwent EGD 5/4, reviewed, with non-bleeding AVMs noted ?-GI recommends f/u colonoscopy if pt agrees ?-Pt now s/p PRBC x3 with hgb up to 8.0 from 6.9 ?-given IV iron and on PO iron supplementation ?-Cont to follow CBC trends ?  ?HTN ?-Home BP meds currently on hold ?-PRN Hydralazine for now. ?-BP stable this AM ?  ?IIDM ?-Continued Metformin at presentation ?-cont SSI as needed ?  ?Thrombocytosis ?-Probably reactive to anemia ?-Plts trending down ?-Cont to follow CBC trends ?  ?  ? ?Subjective: Pt seen in PACU after EGD, still drowsy. Denies abd pain or nausea ? ?Physical Exam: ?Vitals:  ? 07/13/21 0630 07/13/21 0645 07/13/21 0700 07/13/21 0806  ?BP: 138/76 138/79 (!) 148/79 133/67  ?Pulse: (!) 55 63 (!) 51 (!) 56  ?Resp: 16   14  ?Temp: 97.8 ?F (36.6 ?C)   98.6 ?F (37 ?C)  ?TempSrc: Oral   Oral  ?SpO2: 99% 100% 100% 100%  ?Weight:    116.1 kg  ?Height:    6\' 1"  (1.854 m)  ? ?General exam: Awake, laying in bed, in nad ?Respiratory system: Normal respiratory effort, no wheezing ?Cardiovascular system: regular rate, s1, s2 ?Gastrointestinal system: Soft, nondistended, positive BS ?Central nervous system: CN2-12 grossly intact, strength intact ?Extremities: Perfused, no clubbing ?Skin: Normal skin  turgor, no notable skin lesions seen ?Psychiatry: Mood normal // no visual hallucinations  ? ?Data Reviewed: ? ?Labs reviewed: Hgb 6.9->8.0 ? ?Family Communication: Pt in room, family not at bedside ? ?Disposition: ?Status is: Inpatient ?Remains inpatient appropriate because: Severity of illness ? Planned Discharge Destination: Home ? ? ? ? ?Author: ? , MD ?07/13/2021 9:12 AM ? ?For on call review [email or web address].  ?

## 2021-07-13 NOTE — Hospital Course (Signed)
76 y.o. male with medical history significant of HTN, GERD, peptic ulcer status post remote EGD, IIDM, came with worsening of generalized weakness. ?  ?Symptoms started about 1 month ago and gradually getting worse, patient also noticed occasional dark-colored stool but no abdominal pain no nauseous vomiting or abdominal pain ?

## 2021-07-13 NOTE — Anesthesia Preprocedure Evaluation (Signed)
Anesthesia Evaluation  ?Patient identified by MRN, date of birth, ID band ?Patient awake ? ? ? ?Reviewed: ?Allergy & Precautions, NPO status , Patient's Chart, lab work & pertinent test results ? ?Airway ?Mallampati: II ? ?TM Distance: >3 FB ?Neck ROM: Full ? ? ? Dental ?no notable dental hx. ? ?  ?Pulmonary ?neg pulmonary ROS, former smoker,  ?  ?Pulmonary exam normal ?breath sounds clear to auscultation ? ? ? ? ? ? Cardiovascular ?hypertension, Pt. on medications ?negative cardio ROS ?Normal cardiovascular exam ?Rhythm:Regular Rate:Normal ? ? ?  ?Neuro/Psych ?negative neurological ROS ? negative psych ROS  ? GI/Hepatic ?Neg liver ROS, GERD  ,  ?Endo/Other  ?negative endocrine ROSdiabetes, Type 2 ? Renal/GU ?negative Renal ROS  ?negative genitourinary ?  ?Musculoskeletal ?negative musculoskeletal ROS ?(+)  ? Abdominal ?(+) + obese,   ?Peds ?negative pediatric ROS ?(+)  Hematology ? ?(+) Blood dyscrasia, anemia ,   ?Anesthesia Other Findings ? ? Reproductive/Obstetrics ?negative OB ROS ? ?  ? ? ? ? ? ? ? ? ? ? ? ? ? ?  ?  ? ? ? ? ? ? ? ? ?Anesthesia Physical ?Anesthesia Plan ? ?ASA: 3 ? ?Anesthesia Plan: MAC  ? ?Post-op Pain Management: Minimal or no pain anticipated  ? ?Induction: Intravenous ? ?PONV Risk Score and Plan: 1 and Ondansetron, Propofol infusion and Treatment may vary due to age or medical condition ? ?Airway Management Planned: Nasal Cannula ? ?Additional Equipment:  ? ?Intra-op Plan:  ? ?Post-operative Plan:  ? ?Informed Consent: I have reviewed the patients History and Physical, chart, labs and discussed the procedure including the risks, benefits and alternatives for the proposed anesthesia with the patient or authorized representative who has indicated his/her understanding and acceptance.  ? ? ? ?Dental advisory given ? ?Plan Discussed with: CRNA ? ?Anesthesia Plan Comments:   ? ? ? ? ? ? ?Anesthesia Quick Evaluation ? ?

## 2021-07-13 NOTE — Op Note (Signed)
[facility] ?Patient Name: Allen Wiggins ?Procedure Date : 07/13/2021 ?MRN: [id number] ?Attending MD: Willaim Rayas. Adela Lank , MD ?Date of Birth: Mar 11, 1946 ?CSN: [id number] ?Age: 76 ?Admit Type: Inpatient ?Procedure:                Upper GI endoscopy ?Indications:              Symptomatic anemia, Iron deficiency anemia - dark  ?                          stools while on iron ?Providers:                Willaim Rayas. Adela Lank, MD, Rip Harbour, RN,  ?                          Joannie Springs, Technician ?Referring MD:              ?Medicines:                Monitored Anesthesia Care ?Complications:            No immediate complications. Estimated blood loss:  ?                          Minimal. ?Estimated Blood Loss:     Estimated blood loss was minimal. ?Procedure:                Pre-Anesthesia Assessment: ?                          - Prior to the procedure, a History and Physical  ?                          was performed, and patient medications and  ?                          allergies were reviewed. The patient's tolerance of  ?                          previous anesthesia was also reviewed. The risks  ?                          and benefits of the procedure and the sedation  ?                          options and risks were discussed with the patient.  ?                          All questions were answered, and informed consent  ?                          was obtained. Prior Anticoagulants: The patient has  ?                          taken no previous anticoagulant or antiplatelet  ?  agents. ASA Grade Assessment: III - A patient with  ?                          severe systemic disease. After reviewing the risks  ?                          and benefits, the patient was deemed in  ?                          satisfactory condition to undergo the procedure. ?                          After obtaining informed consent, the endoscope was  ?                          passed under  direct vision. Throughout the  ?                          procedure, the patient's blood pressure, pulse, and  ?                          oxygen saturations were monitored continuously. The  ?                          [id number]-[id number] ([id number] Olympus endoscope was introduced  ?                          through the mouth, and advanced to the second part  ?                          of duodenum. The upper GI endoscopy was  ?                          accomplished without difficulty. The patient  ?                          tolerated the procedure well. ?Scope In: ?Scope Out: ?Findings: ?     Esophagogastric landmarks were identified: the Z-line was found at 43  ?     cm, the gastroesophageal junction was found at 43 cm and the upper  ?     extent of the gastric folds was found at 45 cm from the incisors. ?     A 2 cm hiatal hernia was present. ?     The Z-line was slightly irregular and was found 43 cm from the incisors  ?     but did not meet criteria for Barrett's. ?     The exam of the esophagus was otherwise normal. ?     A single small erosion with no stigmata of recent bleeding was found in  ?     the gastric antrum. Small heme noted there from lavage from water jet  ?     but no stigmata or heme there prior ?     The exam of the stomach was otherwise normal. ?     Biopsies were taken with a cold forceps in the gastric  body, at the  ?     incisura and in the gastric antrum for Helicobacter pylori testing. ?     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     second portion of the duodenum. One was friable and oozed with contact  ?     with the endoscope. Fulguration to ablate the lesions to prevent  ?     bleeding by argon plasma was successful. ?     The exam of the duodenum was otherwise normal. ?Impression:               - Esophagogastric landmarks identified. ?                          - 2 cm hiatal hernia. ?                          - Z-line irregular, 43 cm from the incisors. Did  ?                           not meet criteria for BE ?                          - Normal esophagus otherwise ?                          - Gastric erosion with no stigmata of recent  ?                          bleeding. ?                          - Normal stomach otherwise - biopsies taken to rule  ?                          out H pylori ?                          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 Treated with argon plasma coagulation  ?                          (APC). ?                          Possible AVMs could be contributing / causing the  ?                          anemia but no heme noted anywhere or stigmata for  ?                          bleeding. Recommend colonoscopy to clear colon if  ?                          patient is willing, will discuss with him. ?  Recommendation:           - Return patient to hospital ward for ongoing care. ?                          - Clear liquid diet. ?                          - Continue present medications. ?                          - Await pathology results. ?                          - Colonoscopy tomorrow if the patient is willing,  ?                          will discuss with him. ?Procedure Code(s):        --- Professional --- ?                          19147, 59, Esophagogastroduodenoscopy, flexible,  ?                          transoral; with control of bleeding, any method ?                          82956, Esophagogastroduodenoscopy, flexible,  ?                          transoral; with biopsy, single or multiple ?Diagnosis Code(s):        --- Professional --- ?                          K44.9, Diaphragmatic hernia without obstruction or  ?                          gangrene ?                          K22.8, Other specified diseases of esophagus ?                          K31.89, Other diseases of stomach and duodenum ?                          K31.819, Angiodysplasia of stomach and duodenum  ?                           without bleeding ?                          D62, Acute posthemorrhagic anemia ?                          D50.9, Iron deficiency anemia, unspecified ?CPT copyright 2019 American [employer] Association. All rights reserved. ?The codes documented in this report are preliminary and upon coder review may  ?be revised to meet  current compliance requirements. ?Willaim Rayas. Allen Lasure, MD ?07/13/2021 9:49:19 AM ?This report has been signed electronically. ?Number of Addenda: 0 ?

## 2021-07-13 NOTE — Anesthesia Procedure Notes (Signed)
Procedure Name: [place] ?Date/Time: 07/13/2021 9:01 AM ?Performed by: Mariea Clonts, CRNA ?Pre-anesthesia Checklist: Patient identified, Emergency Drugs available, Suction available, Patient being monitored and Timeout performed ?Patient Re-evaluated:Patient Re-evaluated prior to induction ?Oxygen Delivery Method: Simple face mask and Nasal cannula ? ? ? ? ?

## 2021-07-14 ENCOUNTER — Inpatient Hospital Stay (HOSPITAL_COMMUNITY): Payer: No Typology Code available for payment source | Admitting: Anesthesiology

## 2021-07-14 ENCOUNTER — Encounter (HOSPITAL_COMMUNITY)
Admission: EM | Disposition: A | Discharge status: Home / Self Care | Payer: Self-pay | Source: Home / Self Care | Attending: Internal Medicine

## 2021-07-14 ENCOUNTER — Encounter (HOSPITAL_COMMUNITY): Payer: Self-pay | Admitting: Internal Medicine

## 2021-07-14 DIAGNOSIS — K648 Other hemorrhoids: Secondary | ICD-10-CM

## 2021-07-14 DIAGNOSIS — K5521 Angiodysplasia of colon with hemorrhage: Secondary | ICD-10-CM

## 2021-07-14 DIAGNOSIS — K5731 Diverticulosis of large intestine without perforation or abscess with bleeding: Secondary | ICD-10-CM

## 2021-07-14 DIAGNOSIS — D509 Iron deficiency anemia, unspecified: Secondary | ICD-10-CM

## 2021-07-14 DIAGNOSIS — K6381 Dieulafoy lesion of intestine: Secondary | ICD-10-CM

## 2021-07-14 HISTORY — PX: HOT HEMOSTASIS: SHX5433

## 2021-07-14 HISTORY — PX: HEMOSTASIS CLIP PLACEMENT: SHX6857

## 2021-07-14 HISTORY — PX: COLONOSCOPY WITH PROPOFOL: SHX5780

## 2021-07-14 LAB — TYPE AND SCREEN
ABO/RH(D): A POS
Antibody Screen: NEGATIVE
Unit division: 0
Unit division: 0
Unit division: 0

## 2021-07-14 LAB — CBC
HCT: 28.2 % — ABNORMAL LOW (ref 39.0–52.0)
Hemoglobin: 8.2 g/dL — ABNORMAL LOW (ref 13.0–17.0)
MCH: 21.8 pg — ABNORMAL LOW (ref 26.0–34.0)
MCHC: 29.1 g/dL — ABNORMAL LOW (ref 30.0–36.0)
MCV: 75 fL — ABNORMAL LOW (ref 80.0–100.0)
Platelets: 468 10*3/uL — ABNORMAL HIGH (ref 150–400)
RBC: 3.76 MIL/uL — ABNORMAL LOW (ref 4.22–5.81)
RDW: 17.8 % — ABNORMAL HIGH (ref 11.5–15.5)
WBC: 6.1 10*3/uL (ref 4.0–10.5)
nRBC: 0.3 % — ABNORMAL HIGH (ref 0.0–0.2)

## 2021-07-14 LAB — GLUCOSE, CAPILLARY
Glucose-Capillary: 128 mg/dL — ABNORMAL HIGH (ref 70–99)
Glucose-Capillary: 109 mg/dL — ABNORMAL HIGH (ref 70–99)
Glucose-Capillary: 145 mg/dL — ABNORMAL HIGH (ref 70–99)
Glucose-Capillary: 153 mg/dL — ABNORMAL HIGH (ref 70–99)

## 2021-07-14 LAB — BPAM RBC
Blood Product Expiration Date: 202305262359
Blood Product Expiration Date: 202305262359
Blood Product Expiration Date: 202305262359
ISSUE DATE / TIME: 202305031659
ISSUE DATE / TIME: 202305032257
ISSUE DATE / TIME: 202305040412
Unit Type and Rh: 6200
Unit Type and Rh: 6200
Unit Type and Rh: 6200

## 2021-07-14 LAB — SURGICAL PCR SCREEN
MRSA, PCR: NEGATIVE
Staphylococcus aureus: NEGATIVE

## 2021-07-14 LAB — SURGICAL PATHOLOGY

## 2021-07-14 LAB — HEMOGLOBIN A1C
Hgb A1c MFr Bld: 5.9 % — ABNORMAL HIGH (ref 4.8–5.6)
Mean Plasma Glucose: 123 mg/dL

## 2021-07-14 SURGERY — COLONOSCOPY WITH PROPOFOL
Anesthesia: Monitor Anesthesia Care

## 2021-07-14 MED ORDER — LIDOCAINE 2% (20 MG/ML) 5 ML SYRINGE
INTRAMUSCULAR | Status: DC | PRN
  Administered 2021-07-14: 100 mg via INTRAVENOUS

## 2021-07-14 MED ORDER — PROPOFOL 500 MG/50ML IV EMUL
INTRAVENOUS | Status: DC | PRN
  Administered 2021-07-14: 125 ug/kg/min via INTRAVENOUS

## 2021-07-14 MED ORDER — MUPIROCIN 2 % EX OINT
1.0000 "application " | TOPICAL_OINTMENT | Freq: Two times a day (BID) | CUTANEOUS | Status: DC
  Administered 2021-07-14 – 2021-07-15 (×3): 1 via NASAL
  Filled 2021-07-14 (×2): qty 22

## 2021-07-14 MED ORDER — PROMETHAZINE HCL 25 MG/ML IJ SOLN: 6.2500 mg | INTRAMUSCULAR | Status: DC | PRN

## 2021-07-14 MED ORDER — ADULT MULTIVITAMIN W/MINERALS CH
1.0000 | ORAL_TABLET | Freq: Every day | ORAL | Status: DC
  Administered 2021-07-14 – 2021-07-15 (×2): 1 via ORAL
  Filled 2021-07-14 (×2): qty 1

## 2021-07-14 MED ORDER — OXYCODONE HCL 5 MG PO TABS: 5.0000 mg | ORAL_TABLET | Freq: Once | ORAL | Status: DC | PRN

## 2021-07-14 MED ORDER — PROPOFOL 10 MG/ML IV BOLUS
INTRAVENOUS | Status: DC | PRN
  Administered 2021-07-14: 20 mg via INTRAVENOUS

## 2021-07-14 MED ORDER — HYDROMORPHONE HCL 1 MG/ML IJ SOLN: 0.2500 mg | INTRAMUSCULAR | Status: DC | PRN

## 2021-07-14 MED ORDER — LACTATED RINGERS IV SOLN
INTRAVENOUS | Status: DC | PRN
Start: 2021-07-14 — End: 2021-07-14

## 2021-07-14 MED ORDER — OXYCODONE HCL 5 MG/5ML PO SOLN: 5.0000 mg | Freq: Once | ORAL | Status: DC | PRN

## 2021-07-14 SURGICAL SUPPLY — 22 items

## 2021-07-14 NOTE — Progress Notes (Signed)
?  Transition of Care (TOC) Screening Note ? ? ?Patient Details  ?Name: DUSTYN DANSEREAU ?Date of Birth: 1945/08/11 ? ? ?Transition of Care (TOC) CM/SW Contact:    ?Mearl Latin, LCSW ?Phone Number: ?07/14/2021, 12:23 PM ? ? ?[place] 72 Hour Notification Complete: ID# [id number] ? ?Transition of Care Department [facility]) has reviewed patient and no TOC needs have been identified at this time. We will continue to monitor patient advancement through interdisciplinary progression rounds. If new patient transition needs arise, please place a TOC consult. ? ? ?

## 2021-07-14 NOTE — Progress Notes (Addendum)
Pt arrived on unit, bed alarm on, V/S taken, assessment completed, skin assessed and CHG given.  ?

## 2021-07-14 NOTE — Plan of Care (Signed)
  Problem: Health Behavior/Discharge Planning: Goal: Ability to manage health-related needs will improve Outcome: Progressing   Problem: Clinical Measurements: Goal: Ability to maintain clinical measurements within normal limits will improve Outcome: Progressing Goal: Will remain free from infection Outcome: Progressing   

## 2021-07-14 NOTE — Anesthesia Procedure Notes (Signed)
Procedure Name: [place] ?Date/Time: 07/14/2021 10:16 AM ?Performed by: Amadeo Garnet, CRNA ?Pre-anesthesia Checklist: Patient identified, Emergency Drugs available, Suction available and Patient being monitored ?Patient Re-evaluated:Patient Re-evaluated prior to induction ?Oxygen Delivery Method: Simple face mask ?Preoxygenation: Pre-oxygenation with 100% oxygen ?Placement Confirmation: positive ETCO2 ?Dental Injury: Teeth and Oropharynx as per pre-operative assessment  ? ? ? ? ?

## 2021-07-14 NOTE — Progress Notes (Signed)
?  Progress Note ? ? ?Patient: Allen Wiggins TDV:[id number] DOB: [birth year] DOA: 07/12/2021     2 ?DOS: the patient was seen and examined on 07/14/2021 ?  ?Brief hospital course: ?76 y.o. male with medical history significant of HTN, GERD, peptic ulcer status post remote EGD, IIDM, came with worsening of generalized weakness. ?  ?Symptoms started about 1 month ago and gradually getting worse, patient also noticed occasional dark-colored stool but no abdominal pain no nauseous vomiting or abdominal pain ? ?Assessment and Plan: ?No notes have been filed under this hospital service. ?Service: Hospitalist ? 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  ?HTN ?-Home BP meds currently on hold ?-PRN Hydralazine for now. ?-BP remains stable ?  ?IIDM ?-Continued Metformin at presentation ?-cont SSI as needed ?-glycemic trends stable ?  ?Thrombocytosis ?-Probably reactive to anemia ?-Plts currently stable ?-Cont to follow CBC trends ?  ?  ? ?Subjective: Pt seen in PACU after colonoscopy, still sedated, difficult to assess ? ?Physical Exam: ?Vitals:  ? 07/14/21 1106 07/14/21 1121 07/14/21 1136 07/14/21 1557  ?BP: (!) 112/46 (!) 128/54 (!) 153/81 121/89  ?Pulse: (!) 59 (!) 56 (!) 55 (!) 54  ?Resp: 13 12 17 18   ?Temp: (!) 97.1 ?F (36.2 ?C)  97.8 ?F (36.6 ?C) 97.8 ?F (36.6 ?C)  ?TempSrc:    Oral  ?SpO2: 100% 100% 100% 97%  ?Weight:      ?Height:      ? ?General exam: Somewhat conversant, in no acute distress ?Respiratory system: normal chest rise, clear, no audible wheezing ?Cardiovascular system: regular rhythm, s1-s2 ?Gastrointestinal system: Nondistended,  nontender, pos BS ?Central nervous system: No seizures, no tremors ?Extremities: No cyanosis, no joint deformities ?Skin: No rashes, no pallor ?Psychiatry: Affect normal // no auditory hallucinations  ? ?Data Reviewed: ? ?Labs reviewed: Hgb 8.2 ? ?Family Communication: Pt in room, family not at bedside ? ?Disposition: ?Status is: Inpatient ?Remains inpatient appropriate because: Severity of illness ? Planned Discharge Destination: Home ? ? ? ? ?Author: ? , MD ?07/14/2021 6:07 PM ? ?For on call review [email or web address].  ?

## 2021-07-14 NOTE — Interval H&P Note (Signed)
History and Physical Interval Note: Patient had EGD showing 2 small AVMS, ablated with APC, Hgb stable, but unclear if those were causing his symptoms or not - no heme noted in the bowel. Have recommended colonoscopy given his last exam was 5 years ago, in light of his IDA and symptoms. I have discussed risks / benefits of colonoscopy and anesthesia and he wants to proceed. His exam is unchanged, he feels well otherwise. Further recommendations pending the results. ? ? ? ? ?07/14/2021 ?9:55 AM ? ?Allen Wiggins  has presented today for surgery, with the diagnosis of iron deficiency anemia.  The various methods of treatment have been discussed with the patient and family. After consideration of risks, benefits and other options for treatment, the patient has consented to  Procedure(s): ?COLONOSCOPY WITH PROPOFOL (N/A) as a surgical intervention.  The patient's history has been reviewed, patient examined, no change in status, stable for surgery.  I have reviewed the patient's chart and labs.  Questions were answered to the patient's satisfaction.   ? ? ?Willaim Rayas Jameshia Hayashida ? ? ?

## 2021-07-14 NOTE — Op Note (Signed)
[facility]igg[facility] ?Patient Name: Allen Wiggins ?Procedure Date : 07/14/2021 ?MRN: [id number] ?Attending MD: Willaim Rayas. Adela Lank , MD ?Date of Birth: 1946/01/14 ?CSN: [id number] ?Age: 76 ?Admit Type: Inpatient ?Procedure:                Colonoscopy ?Indications:              Symptomatic iron deficiency anemia, dark stools.  ?                          EGD showed 2 small AVMs ablated, colonoscopy to  ?                          clear colon ?Providers:                Willaim Rayas. Adela Lank, MD, Roselie Awkward, RN, Clae  ?                          Financial controller, Pensions consultant ?Referring MD:              ?Medicines:                Monitored Anesthesia Care ?Complications:            No immediate complications. Estimated blood loss:  ?                          Minimal. ?Estimated Blood Loss:     Estimated blood loss was minimal. ?Procedure:                Pre-Anesthesia Assessment: ?                          - Prior to the procedure, a History and Physical  ?                          was performed, and patient medications and  ?                          allergies were reviewed. The patient's tolerance of  ?                          previous anesthesia was also reviewed. The risks  ?                          and benefits of the procedure and the sedation  ?                          options and risks were discussed with the patient.  ?                          All questions were answered, and informed consent  ?                          was obtained. Prior Anticoagulants: The patient has  ?                          taken no  previous anticoagulant or antiplatelet  ?                          agents. ASA Grade Assessment: III - A patient with  ?                          severe systemic disease. After reviewing the risks  ?                          and benefits, the patient was deemed in  ?                          satisfactory condition to undergo the procedure. ?                          After obtaining informed consent, the  colonoscope  ?                          was passed under direct vision. Throughout the  ?                          procedure, the patient's blood pressure, pulse, and  ?                          oxygen saturations were monitored continuously. The  ?                          [id number] ([id number]) Olympus coloscope was  ?                          introduced through the anus and advanced to the the  ?                          terminal ileum, with identification of the  ?                          appendiceal orifice and IC valve. The colonoscopy  ?                          was performed without difficulty. The patient  ?                          tolerated the procedure well. The quality of the  ?                          bowel preparation was adequate in most of colon,  ?                          fair in right colon / cecum. The terminal ileum,  ?                          ileocecal valve, appendiceal orifice, and rectum  ?  were photographed. ?Scope In: 10:18:40 AM ?Scope Out: 10:58:49 AM ?Scope Withdrawal Time: 0 hours 31 minutes 32 seconds  ?Total Procedure Duration: 0 hours 40 minutes 9 seconds  ?Findings: ?     The perianal and digital rectal examinations were normal. ?     The terminal ileum appeared normal. No heme ?     A single small angiodysplastic lesion was found in the cecum. Given  ?     clearly bleeding source in transverse colon I did not ablate this,  ?     unlikely to be related to his anemia / bleeding. ?     Red blood was found in the rectum, in the recto-sigmoid colon, in the  ?     sigmoid colon, in the transverse colon and at the hepatic flexure. Brown  ?     stool noted throughout the right colon without blood. ?     A single small dieulafoy lesion with active bleeding was found in the  ?     distal transverse colon. Fulguration to stop the bleeding by argon  ?     plasma was successful. One hemostatic clip was then successfully placed  ?     across the lesion. ?     A few  small-mouthed diverticula were found in the transverse colon. ?     Internal hemorrhoids were found during retroflexion. The hemorrhoids  ?     were small. ?     There was significant looping in the left colon. Several minutes spent  ?     lavaging the colon to clear old blood / prep. The exam was otherwise  ?     without abnormality. ?Impression:               - The examined portion of the ileum was normal. ?                          - A single colonic angiodysplastic lesion in the  ?                          cecum which had no stigmata for bleeding. ?                          - Blood in the rectum, in the recto-sigmoid colon,  ?                          in the sigmoid colon, in the transverse colon and  ?                          at the hepatic flexure. ?                          - A single bleeding colonic dieulafoy lesion.  ?                          Treated with argon plasma coagulation (APC). Clip  ?                          was placed. ?                          -  Diverticulosis in the transverse colon. ?                          - Internal hemorrhoids. ?                          - The examination was otherwise normal. ?Recommendation:           - Return patient to [facility] ward for ongoing care. ?                          - Resume regular diet today ?                          - Continue present medications. ?                          - Monitor in [facility]ins today on regular diet,  ?                          monitor for recurrent bleeding ?                          - Discharge home tomorrow if stable ?                          - Would benefit from IV Iron infusion if not yet  ?                          given ?Procedure Code(s):        --- Professional --- ?                          [phone], Colonoscopy, flexible; with control of  ?                          bleeding, any method ?Diagnosis Code(s):        --- Professional --- ?                          W23.7, Other hemorrhoids ?                          K55.21,  Angiodysplasia of colon with hemorrhage ?                          K62.5, Hemorrhage of anus and rectum ?                          K92.2, Gastrointestinal hemorrhage, unspecified ?                          D50.9, Iron deficiency anemia, unspecified ?                          K57.30, Diverticulosis of large intestine without  ?  perforation or abscess without bleeding ?CPT copyright 2019 American Medical Association. All rights reserved. ?The codes documented in this report are preliminary and upon coder review may  ?be revised to meet current compliance requirements. ?Willaim RayasSteven P. Trevell Pariseau, MD ?07/14/2021 11:13:59 AM ?This report has been signed electronically. ?Number of Addenda: 0 ?

## 2021-07-14 NOTE — Anesthesia Preprocedure Evaluation (Addendum)
Anesthesia Evaluation  ?Patient identified by MRN, date of birth, ID band ?Patient awake ? ? ? ?Reviewed: ?Allergy & Precautions, NPO status , Patient's Chart, lab work & pertinent test results ? ?Airway ?Mallampati: II ? ?TM Distance: >3 FB ?Neck ROM: Full ? ? ? Dental ? ?(+) Edentulous Upper ?  ?Pulmonary ?neg pulmonary ROS, former smoker,  ?  ?Pulmonary exam normal ?breath sounds clear to auscultation ? ? ? ? ? ? Cardiovascular ?hypertension, negative cardio ROS ?Normal cardiovascular exam ?Rhythm:Regular Rate:Normal ? ? ?  ?Neuro/Psych ?negative neurological ROS ? negative psych ROS  ? GI/Hepatic ?Neg liver ROS, PUD, GERD  Medicated,  ?Endo/Other  ?negative endocrine ROSdiabetes, Type 2 ? Renal/GU ?negative Renal ROS  ?negative genitourinary ?  ?Musculoskeletal ?negative musculoskeletal ROS ?(+)  ? Abdominal ?  ?Peds ?negative pediatric ROS ?(+)  Hematology ? ?(+) Blood dyscrasia, anemia ,   ?Anesthesia Other Findings ? ? Reproductive/Obstetrics ?negative OB ROS ? ?  ? ? ? ? ? ? ? ? ? ? ? ? ? ?  ?  ? ? ? ? ? ? ? ?Anesthesia Physical ?Anesthesia Plan ? ?ASA: 3 ? ?Anesthesia Plan: MAC  ? ?Post-op Pain Management:   ? ?Induction: Intravenous ? ?PONV Risk Score and Plan: 1 and Treatment may vary due to age or medical condition ? ?Airway Management Planned: Natural Airway and Simple Face Mask ? ?Additional Equipment:  ? ?Intra-op Plan:  ? ?Post-operative Plan: Extubation in OR ? ?Informed Consent: I have reviewed the patients History and Physical, chart, labs and discussed the procedure including the risks, benefits and alternatives for the proposed anesthesia with the patient or authorized representative who has indicated his/her understanding and acceptance.  ? ? ? ?Dental advisory given ? ?Plan Discussed with: CRNA and Anesthesiologist ? ?Anesthesia Plan Comments:   ? ? ? ? ? ?Anesthesia Quick Evaluation ? ?

## 2021-07-14 NOTE — Progress Notes (Signed)
Initial Nutrition Assessment ? ?DOCUMENTATION CODES:  ? ?Not applicable ? ?INTERVENTION:  ? ?Multivitamin w/ minerals daily ?Meal ordering with assist ?Encourage good PO intake  ? ?NUTRITION DIAGNOSIS:  ? ?Inadequate oral intake related to poor appetite as evidenced by per patient/family report. ? ?GOAL:  ? ?Patient will meet greater than or equal to 90% of their needs ? ?MONITOR:  ? ?PO intake, Weight trends ? ?REASON FOR ASSESSMENT:  ? ?Malnutrition Screening Tool ?  ? ?ASSESSMENT:  ? ?76 y.o. male presented to the ED with generalized weakness and occasional dark stools. PMH include GERD, HTN, and DM. Pt admitted with symptomatic anemia 2/2 GI bleed.  ? ?5/04 - EGD ?5/05 - Colonoscopy  ? ?Pt reports that his appetite has been poor for a few weeks, stated that he just has not felt like eating. Reports that he would eat shredded wheat for lunch and soup for dinner. Reports that his appetite has improve some. Pt provided RD with lunch order to be placed. ? ?Pt reports that his UBW was in the 270's# and that he is know 256#. Stating the last time he was his UBW was in February. No weights within the past year recorded within EMR.  ? ?Medications reviewed and include: Ferrous Sulfate, SSI 0-15 units TID, Metformin, Protonix, IV ferric gluconate  ?Labs reviewed. ? ?NUTRITION - FOCUSED PHYSICAL EXAM: ? ?Deferred to follow-up.  ? ?Diet Order:   ?Diet Order   ? ?       ?  Diet regular Room service appropriate? Yes; Fluid consistency: Thin  Diet effective now       ?  ? ?  ?  ? ?  ? ? ?EDUCATION NEEDS:  ? ?No education needs have been identified at this time ? ?Skin:  Skin Assessment: Reviewed RN Assessment ? ?Last BM:  5/5 ? ?Height:  ?Ht Readings from Last 1 Encounters:  ?07/13/21 6\' 1"  (1.854 m)  ? ?Weight:  ?Wt Readings from Last 1 Encounters:  ?07/14/21 115.4 kg  ? ?Ideal Body Weight:  83.6 kg ? ?BMI:  Body mass index is 33.57 kg/m?. ? ?Estimated Nutritional Needs:  ?Kcal:  2100-2300 ?Protein:  105-120 grams ?Fluid:   >/= 2.1 L ? ? ?09/13/21 RD, LDN ?Clinical Dietitian ?See AMiON for contact information.  ? ?

## 2021-07-14 NOTE — Transfer of Care (Signed)
Immediate Anesthesia Transfer of Care Note ? ?Patient: Allen Wiggins ? ?Procedure(s) Performed: COLONOSCOPY WITH PROPOFOL ?HOT HEMOSTASIS (ARGON PLASMA COAGULATION/BICAP) ?HEMOSTASIS CLIP PLACEMENT ? ?Patient Location: PACU ? ?Anesthesia Type:MAC ? ?Level of Consciousness: drowsy and patient cooperative ? ?Airway & Oxygen Therapy: Patient Spontanous Breathing and Patient connected to face mask oxygen ? ?Post-op Assessment: Report given to RN, Post -op Vital signs reviewed and stable and Patient moving all extremities ? ?Post vital signs: Reviewed and stable ? ?Last Vitals:  ?Vitals Value Taken Time  ?BP 112/46 07/14/21 1106  ?Temp    ?Pulse 58 07/14/21 1107  ?Resp 9 07/14/21 1107  ?SpO2 100 % 07/14/21 1107  ?Vitals shown include unvalidated device data. ? ?Last Pain:  ?Vitals:  ? 07/14/21 0911  ?TempSrc: Temporal  ?PainSc: 0-No pain  ?   ? ?Patients Stated Pain Goal: 0 (07/14/21 0206) ? ?Complications: No notable events documented. ?

## 2021-07-15 DIAGNOSIS — K922 Gastrointestinal hemorrhage, unspecified: Secondary | ICD-10-CM

## 2021-07-15 DIAGNOSIS — K6381 Dieulafoy lesion of intestine: Principal | ICD-10-CM

## 2021-07-15 DIAGNOSIS — E1169 Type 2 diabetes mellitus with other specified complication: Secondary | ICD-10-CM

## 2021-07-15 LAB — CBC
HCT: 28 % — ABNORMAL LOW (ref 39.0–52.0)
Hemoglobin: 8.1 g/dL — ABNORMAL LOW (ref 13.0–17.0)
MCH: 21.7 pg — ABNORMAL LOW (ref 26.0–34.0)
MCHC: 28.9 g/dL — ABNORMAL LOW (ref 30.0–36.0)
MCV: 75.1 fL — ABNORMAL LOW (ref 80.0–100.0)
Platelets: 491 10*3/uL — ABNORMAL HIGH (ref 150–400)
RBC: 3.73 MIL/uL — ABNORMAL LOW (ref 4.22–5.81)
RDW: 18.3 % — ABNORMAL HIGH (ref 11.5–15.5)
WBC: 7.5 10*3/uL (ref 4.0–10.5)
nRBC: 0 % (ref 0.0–0.2)

## 2021-07-15 LAB — GLUCOSE, CAPILLARY
Glucose-Capillary: 163 mg/dL — ABNORMAL HIGH (ref 70–99)
Glucose-Capillary: 223 mg/dL — ABNORMAL HIGH (ref 70–99)

## 2021-07-15 MED ORDER — SODIUM CHLORIDE 0.9 % IV SOLN
250.0000 mg | Freq: Once | INTRAVENOUS | Status: AC
  Administered 2021-07-15: 250 mg via INTRAVENOUS
  Filled 2021-07-15: qty 20

## 2021-07-15 MED ORDER — FERROUS SULFATE 325 (65 FE) MG PO TABS: 325.0000 mg | ORAL_TABLET | Freq: Two times a day (BID) | ORAL | 3 refills | Status: AC

## 2021-07-15 NOTE — Plan of Care (Signed)
?  Problem: Health Behavior/Discharge Planning: ?Goal: Ability to manage health-related needs will improve ?07/15/2021 1542 by Waynette Buttery, RN ?Outcome: Adequate for Discharge ?07/15/2021 1237 by Waynette Buttery, RN ?Outcome: Progressing ?  ?Problem: Clinical Measurements: ?Goal: Ability to maintain clinical measurements within normal limits will improve ?07/15/2021 1542 by Waynette Buttery, RN ?Outcome: Adequate for Discharge ?07/15/2021 1237 by Waynette Buttery, RN ?Outcome: Progressing ?Goal: Will remain free from infection ?07/15/2021 1542 by Waynette Buttery, RN ?Outcome: Adequate for Discharge ?07/15/2021 1237 by Waynette Buttery, RN ?Outcome: Progressing ?  ?

## 2021-07-15 NOTE — Plan of Care (Signed)
  Problem: Health Behavior/Discharge Planning: Goal: Ability to manage health-related needs will improve Outcome: Progressing   Problem: Clinical Measurements: Goal: Ability to maintain clinical measurements within normal limits will improve Outcome: Progressing Goal: Will remain free from infection Outcome: Progressing   

## 2021-07-15 NOTE — Progress Notes (Signed)
? ? ? ?   Progress Note ? ? Subjective  ?No bowel movements since colonoscopy. Feels well. No further bleeding. Hgb stable. ? ? Objective  ? ?Vital signs in last 24 hours: ?Temp:  [97.1 ?F (36.2 ?C)-98.5 ?F (36.9 ?C)] 98.5 ?F (36.9 ?C) (05/06 [id number]) ?Pulse Rate:  [52-59] 59 (05/06 0731) ?Resp:  [12-18] 18 (05/06 0731) ?BP: (112-154)/(46-89) 149/79 (05/06 0731) ?SpO2:  [95 %-100 %] 98 % (05/06 0731) ?Last BM Date : 07/14/21 ?General:    AA male in NAD ?Neurologic:  Alert and oriented,  grossly normal neurologically. ?Psych:  Cooperative. Normal mood and affect. ? ?Intake/Output from previous day: ?05/05 0701 - 05/06 0700 ?In: 200 [I.V.:200] ?Out: 650 [Urine:650] ?Intake/Output this shift: ?No intake/output data recorded. ? ?Lab Results: ?Recent Labs  ?  07/13/21 ?0644 07/14/21 ?0829 07/15/21 ?0111  ?WBC 6.7 6.1 7.5  ?HGB 8.0* 8.2* 8.1*  ?HCT 28.0* 28.2* 28.0*  ?PLT 465* 468* 491*  ? ?BMET ?Recent Labs  ?  07/12/21 ?1409  ?NA 138  ?K 3.7  ?CL 107  ?CO2 21*  ?GLUCOSE 148*  ?BUN 27*  ?CREATININE 1.43*  ?CALCIUM 8.8*  ? ?LFT ?Recent Labs  ?  07/12/21 ?1409  ?PROT 7.1  ?ALBUMIN 3.7  ?AST 19  ?ALT 14  ?ALKPHOS 85  ?BILITOT 0.5  ? ?PT/INR ?No results for input(s): LABPROT, INR in the last 72 hours. ? ?Studies/Results: ?No results found. ? ? ? ? Assessment / Plan:   ? ?76 y/o male with history as below, presented with symptomatic anemia, Hgb of 6, IDA. ? ?EGD done initially and 2 duodenal AVMs cauterized, neither with any active bleeding. Colonoscopy done yesterday showed a suprisingly actively bleeding dieulafoy lesion which was treated with APC and hemostasis clip with good result. No further bleeding since the procedure, tolerating regular diet, Hgb stable. ? ?I think reasonable to give a dose of IV iron today but otherwise stable for discharge.  ? ?He should have a CBC checked in the next 1-2 weeks (he states his PCP will do this at the [place]) and our office will contact him to coordinate outpatient follow up. ? ?Discussed  findings and course with the patient, he understands and agrees with the plan. ? ?Call with questions. ? ?Jolly Mango, MD ?[facility] ? ?

## 2021-07-15 NOTE — Progress Notes (Signed)
Ferric gluconate 250mg  administered IV this shift. Discharge paperwork reviewed with patient at this time. Patient reports understanding of discharge criteria and does not have any further questions. Patient requested a letter to go back to work. MD made aware and completed this request. Patient reports no further needs at this time. IV has been removed. Personal belongings returned to patient upon discharge. Wheelchair assess to transfer provided with staff transporter to assist client to his vehicle. ?

## 2021-07-15 NOTE — Discharge Summary (Signed)
? ?PATIENT DETAILS ?Name: Allen Wiggins ?Age: 76 y.o. ?Sex: male ?Date of Birth: 03-Sep-1945 ?MRN: [id number]. ?Admitting Physician: Lequita Halt, MD ?PCP:Pcp, No ? ?Admit Date: 07/12/2021 ?Discharge date: 07/15/2021 ? ?Recommendations for Outpatient Follow-up:  ?Follow up with PCP in 1-2 weeks ?Please obtain CMP/CBC in one week ? ? ?Admitted From:  ?Home ? ?Disposition: ?Home ?  ?Discharge Condition: ?good ? ?CODE STATUS: ?  Code Status: Full Code  ? ?Diet recommendation:  ?Diet Order   ? ?       ?  Diet - low sodium heart healthy       ?  ?  Diet Carb Modified       ?  ?  Diet regular Room service appropriate? Yes; Fluid consistency: Thin  Diet effective now       ?  ? ?  ?  ? ?  ?  ? ?Brief Summary: ?76 year old male with history of HTN, HLD, DM-2-presented to the ED with weakness-melena-found to have acute blood loss anemia in the setting of GI bleeding.  Subsequently admitted to the hospitalist service. ? ?Significant studies: ?5/4>> EGD: 2 nonbleeding AVMs ?5/5>> colonoscopy: Single bleeding ddieulafoy lesion -s/p APC.  Diverticulosis, single colonic AVM. ? ?Consults: ?GI ? ?Brief Hospital Course: ?Lower GI bleeding with acute blood loss anemia: Managed with supportive care-and endoscopic evaluation-colonoscopy showed active bleeding from a dieulafoy lesion s/p APC-required total of 3 units of PRBC.  CBC this morning with stable hemoglobin-no further bleeding-stable for discharge.  Plan is to repeat CBC in the next 1-2 weeks at PCPs office, GI will arrange for further follow-up in the outpatient setting.  Since iron indices consistent with iron deficiency anemia-we will get 1 dose of IV iron before discharge-subsequently will be placed on oral iron supplementation. ? ?HTN: BP slowly creeping up-initially all antihypertensives held-should be able to resume on discharge.  ? ?DM-2 (A1c 5.9 on 5/4): CBG stable on SSI while inpatient-resume glipizide on discharge. ? ?HLD: Resume statin on discharge ? ?GERD: Continue PPI  on discharge ? ?Nutrition Status: ?Nutrition Problem: Inadequate oral intake ?Etiology: poor appetite ?Signs/Symptoms: per patient/family report ?Interventions: MVI ?  ? ?Obesity: ?Estimated body mass index is 33.57 kg/m? as calculated from the following: ?  Height as of this encounter: 6\' 1"  (1.854 m). ?  Weight as of this encounter: 115.4 kg.  ? ?Discharge Diagnoses:  ?Principal Problem: ?  GI bleed ?Active Problems: ?  Iron deficiency anemia due to chronic blood loss ?  Gastritis and gastroduodenitis ?  AVM (arteriovenous malformation) of small bowel, acquired ?  Dieulafoy lesion of colon ? ? ?Discharge Instructions: ? ?Activity:  ?As tolerated  ? ?Discharge Instructions   ? ? Call MD for:   Complete by: As directed ?  ? Dark stool/bloody stools  ? Diet - low sodium heart healthy   Complete by: As directed ?  ? Diet Carb Modified   Complete by: As directed ?  ? Discharge instructions   Complete by: As directed ?  ? Follow with Primary MD  in 1-2 weeks ? ?Please get a complete blood count and chemistry panel checked by your Primary MD at your next visit, and again as instructed by your Primary MD. ? ?Get Medicines reviewed and adjusted: ?Please take all your medications with you for your next visit with your Primary MD ? ?Laboratory/radiological data: ?Please request your Primary MD to go over all hospital tests and procedure/radiological results at the follow up, please ask your Primary MD  to get all Hospital records sent to his/her office. ? ?In some cases, they will be blood work, cultures and biopsy results pending at the time of your discharge. Please request that your primary care M.D. follows up on these results. ? ?Also Note the following: ?If you experience worsening of your admission symptoms, develop shortness of breath, life threatening emergency, suicidal or homicidal thoughts you must seek medical attention immediately by calling 911 or calling your MD immediately  if symptoms less severe. ? ?You  must read complete instructions/literature along with all the possible adverse reactions/side effects for all the Medicines you take and that have been prescribed to you. Take any new Medicines after you have completely understood and accpet all the possible adverse reactions/side effects.  ? ?Do not drive when taking Pain medications or sleeping medications (Benzodaizepines) ? ?Do not take more than prescribed Pain, Sleep and Anxiety Medications. It is not advisable to combine anxiety,sleep and pain medications without talking with your primary care practitioner ? ?Special Instructions: If you have smoked or chewed Tobacco  in the last 2 yrs please stop smoking, stop any regular Alcohol  and or any Recreational drug use. ? ?Wear Seat belts while driving. ? ?Please note: ?You were cared for by a hospitalist during your hospital stay. Once you are discharged, your primary care physician will handle any further medical issues. Please note that NO REFILLS for any discharge medications will be authorized once you are discharged, as it is imperative that you return to your primary care physician (or establish a relationship with a primary care physician if you do not have one) for your post hospital discharge needs so that they can reassess your need for medications and monitor your lab values.  ? Increase activity slowly   Complete by: As directed ?  ? ?  ? ?Allergies as of 07/15/2021   ? ?   Reactions  ? Tomato Anaphylaxis, Shortness Of Breath  ? ?  ? ?  ?Medication List  ?  ? ?STOP taking these medications   ? ?HYDROcodone-acetaminophen 5-325 MG tablet ?Commonly known as: NORCO/VICODIN ?  ?ibuprofen 600 MG tablet ?Commonly known as: ADVIL ?  ? ?  ? ?TAKE these medications   ? ?Cholecalciferol 50 MCG (2000 UT) Tabs ?Take 50 mcg by mouth daily after breakfast. ?  ?dorzolamide 2 % ophthalmic solution ?Commonly known as: TRUSOPT ?Place 1 drop into both eyes in the morning and at bedtime. ?  ?ferrous sulfate 325 (65 FE) MG  tablet ?Take 1 tablet (325 mg total) by mouth 2 (two) times daily with a meal. ?What changed: when to take this ?  ?glipiZIDE 10 MG tablet ?Commonly known as: GLUCOTROL ?Take 10 mg by mouth in the morning and at bedtime. ?  ?latanoprost 0.005 % ophthalmic solution ?Commonly known as: XALATAN ?Place 1 drop into both eyes at bedtime. ?  ?lisinopril-hydrochlorothiazide 20-25 MG tablet ?Commonly known as: ZESTORETIC ?Take 1 tablet by mouth daily. ?  ?pantoprazole 40 MG tablet ?Commonly known as: PROTONIX ?Take 40 mg by mouth daily. ?  ?pravastatin 40 MG tablet ?Commonly known as: PRAVACHOL ?Take 40 mg by mouth at bedtime. ?  ? ?  ? ? Follow-up Information   ? ? Primary care practitioner. Schedule an appointment as soon as possible for a visit in 1 week(s).   ?Why: Repeat Complete Blood Count ? ?  ?  ? ? [facility] Follow up.   ?Specialty: Gastroenterology ?Why: Office will call with date/time, If you dont  hear from them,please give them a call ?Contact information: ?[place] ?[place] [phone] ?[phone] ? ?  ?  ? ?  ?  ? ?  ? ?Allergies  ?Allergen Reactions  ? Tomato Anaphylaxis and Shortness Of Breath  ? ? ? ?Other Procedures/Studies: ?No results found. ? ? ?TODAY-DAY OF DISCHARGE: ? ?Subjective:  ? ?Allen Wiggins today has no headache,no chest abdominal pain,no new weakness tingling or numbness, feels much better wants to go home today. ? ?Objective:  ? ?Blood pressure (!) 149/79, pulse (!) 59, temperature 98.5 ?F (36.9 ?C), temperature source Oral, resp. rate 18, height 6\' 1"  (1.854 m), weight 115.4 kg, SpO2 98 %. ? ?Intake/Output Summary (Last 24 hours) at 07/15/2021 0924 ?Last data filed at 07/15/2021 0411 ?Gross per 24 hour  ?Intake 200 ml  ?Output 650 ml  ?Net -450 ml  ? ?Filed Weights  ? 07/12/21 1343 07/13/21 0806 07/14/21 0121  ?Weight: 113.4 kg 116.1 kg 115.4 kg  ? ? ?Exam: ?Awake Alert, Oriented *3, No new F.N deficits, Normal affect ?[place].AT,PERRAL ?Supple Neck,No  JVD, No cervical lymphadenopathy appriciated.  ?Symmetrical Chest wall movement, Good air movement bilaterally, CTAB ?RRR,No Gallops,Rubs or new Murmurs, No Parasternal Heave ?+ve B.Sounds, Abd Soft, Non t

## 2021-07-17 ENCOUNTER — Encounter (HOSPITAL_COMMUNITY): Payer: Self-pay | Admitting: Gastroenterology

## 2021-07-17 ENCOUNTER — Telehealth: Payer: Self-pay

## 2021-07-17 NOTE — Telephone Encounter (Signed)
-----   Message from Benancio Deeds, MD sent at 07/15/2021  8:49 AM EDT ----- ?Regarding: outpatient follow up ?Can you help coordinate an outpatient follow up visit with me in 1-2 months? Discharging this weekend. Thanks ? ? ?

## 2021-07-17 NOTE — Anesthesia Postprocedure Evaluation (Signed)
Anesthesia Post Note ? ?Patient: Allen Wiggins ? ?Procedure(s) Performed: COLONOSCOPY WITH PROPOFOL ?HOT HEMOSTASIS (ARGON PLASMA COAGULATION/BICAP) ?HEMOSTASIS CLIP PLACEMENT ? ?  ? ?Patient location during evaluation: Endoscopy ?Anesthesia Type: MAC ?Level of consciousness: awake and alert ?Pain management: pain level controlled ?Vital Signs Assessment: post-procedure vital signs reviewed and stable ?Respiratory status: spontaneous breathing, nonlabored ventilation and respiratory function stable ?Cardiovascular status: blood pressure returned to baseline and stable ?Postop Assessment: no apparent nausea or vomiting ?Anesthetic complications: no ? ? ?No notable events documented. ? ?Last Vitals:  ?Vitals:  ? 07/15/21 0731 07/15/21 1159  ?BP: (!) 149/79 (!) 151/92  ?Pulse: (!) 59 66  ?Resp: 18 18  ?Temp: 36.9 ?C 37.1 ?C  ?SpO2: 98% 93%  ?  ?Last Pain:  ?Vitals:  ? 07/15/21 1159  ?TempSrc: Oral  ?PainSc: 0-No pain  ? ? ?  ?  ?  ?  ?  ?  ? ?Merlinda Frederick ? ? ? ? ?

## 2021-07-17 NOTE — Telephone Encounter (Signed)
Called and spoke with patient. He has been scheduled for a hospital follow up appt with Dr. Havery Moros on Thursday, 08/24/21 at 2:10 pm. Pt is aware that I will mail his appt information to him with our office address. Pt confirmed address on file. Pt verbalized understanding and had no concerns at the end of the call. ?

## 2021-08-24 ENCOUNTER — Encounter: Payer: Self-pay | Admitting: Gastroenterology

## 2021-08-24 ENCOUNTER — Ambulatory Visit (INDEPENDENT_AMBULATORY_CARE_PROVIDER_SITE_OTHER): Payer: Self-pay | Admitting: Gastroenterology

## 2021-08-24 VITALS — BP 110/70 | HR 66 | Ht 73.0 in | Wt 262.0 lb

## 2021-08-24 DIAGNOSIS — D649 Anemia, unspecified: Secondary | ICD-10-CM

## 2021-08-24 DIAGNOSIS — D509 Iron deficiency anemia, unspecified: Secondary | ICD-10-CM

## 2021-08-24 NOTE — Patient Instructions (Signed)
Avoid all NSAIDS  If you are age 77 or older, your body mass index should be between 23-30. Your Body mass index is 34.57 kg/m. If this is out of the aforementioned range listed, please consider follow up with your Primary Care Provider.  If you are age 20 or younger, your body mass index should be between 19-25. Your Body mass index is 34.57 kg/m. If this is out of the aformentioned range listed, please consider follow up with your Primary Care Provider.   ________________________________________________________  The [facility] providers would like to encourage you to use [facility] to communicate with providers for non-urgent requests or questions.  Due to long hold times on the telephone, sending your provider a message by [facility] may be a faster and more efficient way to get a response.  Please allow 48 business hours for a response.  Please remember that this is for non-urgent requests.  _______________________________________________________  Thank you for choosing me and [facility].  Dr. Ileene Patrick

## 2021-08-24 NOTE — Progress Notes (Signed)
HPI :  76 year old male here for a posthospital visit.  I met him in early May when he presented to the hospital with symptomatic anemia, hemoglobin of 6 in the setting of a reported history of iron deficiency.  He had been on iron supplementation with some dark stools at baseline.  He had fatigue and dyspnea on exertion as his main presenting symptoms.  He denies any overt bleeding at that time.  He initially underwent an EGD with me and had 2 small duodenal AVMs that were cauterized, and neither with active bleeding or stigmata of recent bleeding.  He then underwent a colonoscopy which surprisingly showed an actively bleeding Dula Foy lesion in his transverse colon.  This was treated with APC and hemostasis clip with a good result.  He had no further bleeding after the exams, hemoglobin stabilized.  I had recommended a dose of IV iron prior to discharge.  He states has been feeling really well since his discharge from the hospital.  His energy is improved, his dyspnea has resolved.  He is able to mow his grass and function normally.  He denies any red blood per rectum.  He states he is tolerating oral iron and his stools are actually normal color at this time.  He denies any routine NSAID use other than occasional Goody powder.  He states he was using that more frequently prior to the hospitalization, has since not been using it much.  He reports he was at the [place] [facility] in [place] yesterday and had blood work to check his iron levels and blood counts.  He has not heard the results of that yet.  I do not see any results of that in his chart.  Endoscopic work-up as outlined below:  EGD 07/13/21: - 2 cm hiatal hernia. - Z-line irregular, 43 cm from the incisors. Did not meet criteria for BE - Normal esophagus otherwise - Gastric erosion with no stigmata of recent bleeding. - Normal stomach otherwise - biopsies taken to rule out H pylori - Two non-bleeding angiodysplastic lesions in the duodenum.  Treated with argon plasma coagulation (APC).   Colonoscopy 07/14/21: The examined portion of the ileum was normal. - A single colonic angiodysplastic lesion in the cecum which had no stigmata for bleeding. - Blood in the rectum, in the recto-sigmoid colon, in the sigmoid colon, in the transverse colon and at the hepatic flexure. - A single bleeding colonic dieulafoy lesion. Treated with argon plasma coagulation (APC). Clip was placed. - Diverticulosis in the transverse colon. - Internal hemorrhoids. - The examination was otherwise normal.   08/19/2017 colonoscopy Medications: MAC Findings Sessile polyp 48m in size in the proximal descending colon. Polypectomy  performed with Cold snare. Specimen submited, pathology pending. Minimal diverticulosis in the sigmoid colon. Tortuous colonic anatomy. Otherwise normal exam to the appendiceal orifice. DIAGNOSIS:  DESCENDING COLON POLYP:  Tubular adenoma.  Told due for repeat colon in 2026 given last evaluation    Past Medical History:  Diagnosis Date   [place]    GERD (gastroesophageal reflux disease)    Hypertension    Iron deficiency anemia    PUD (peptic ulcer disease) 05/2013   [facility]     Past Surgical History:  Procedure Laterality Date   BIOPSY  07/13/2021   Procedure: BIOPSY;  Surgeon: AYetta Flock MD;  [facility];  Service: Gastroenterology;;   COLONOSCOPY WITH PROPOFOL N/A 07/14/2021   Procedure: COLONOSCOPY WITH PROPOFOL;  Surgeon: Yetta Flock, MD;  [facility];  Service: Gastroenterology;  Laterality: N/A;   ESOPHAGOGASTRODUODENOSCOPY (EGD) WITH PROPOFOL N/A 07/13/2021   Procedure: ESOPHAGOGASTRODUODENOSCOPY (EGD) WITH PROPOFOL;  Surgeon: Yetta Flock, MD;  Location: [place];  Service: Gastroenterology;  Laterality: N/A;   HEMOSTASIS CLIP PLACEMENT  07/14/2021   Procedure: HEMOSTASIS CLIP PLACEMENT;   Surgeon: Yetta Flock, MD;  Location: [place] ENDOSCOPY;  Service: Gastroenterology;;   HERNIA REPAIR     HOT HEMOSTASIS N/A 07/13/2021   Procedure: HOT HEMOSTASIS (ARGON PLASMA COAGULATION/BICAP);  Surgeon: Yetta Flock, MD;  Location[facility] ENDOSCOPY;  Service: Gastroenterology;  Laterality: N/A;   HOT HEMOSTASIS N/A 07/14/2021   Procedure: HOT HEMOSTASIS (ARGON PLASMA COAGULATION/BICAP);  Surgeon: Yetta Flock, MD;  Location: [facility] ENDOSCOPY;  Service: Gastroenterology;  Laterality: N/A;   LEFT HEART CATHETERIZATION WITH CORONARY ANGIOGRAM N/A 05/25/2013   Procedure: LEFT HEART CATHETERIZATION WITH CORONARY ANGIOGRAM;  Surgeon: Peter M Martinique, MD;  [facility];  Service: Cardiovascular;  Laterality: N/A;   Family History  Problem Relation Age of Onset   Skin cancer Mother    Diabetes Mellitus II Father    Stroke Sister    CAD Neg Hx    Social History   Tobacco Use   Smoking status: Former    Types: Cigarettes   Smokeless tobacco: Never  Substance Use Topics   Alcohol use: Yes    Comment: ocassional   Current Outpatient Medications  Medication Sig Dispense Refill   Cholecalciferol 50 MCG (2000 UT) TABS Take 50 mcg by mouth daily after breakfast.     dorzolamide (TRUSOPT) 2 % ophthalmic solution Place 1 drop into both eyes in the morning and at bedtime.     ferrous sulfate 325 (65 FE) MG tablet Take 1 tablet (325 mg total) by mouth 2 (two) times daily with a meal. 30 tablet 3   glipiZIDE (GLUCOTROL) 10 MG tablet Take 10 mg by mouth in the morning and at bedtime.     lisinopril-hydrochlorothiazide (ZESTORETIC) 20-25 MG tablet Take 1 tablet by mouth daily.     pantoprazole (PROTONIX) 40 MG tablet Take 40 mg by mouth daily.     pravastatin (PRAVACHOL) 40 MG tablet Take 40 mg by mouth at bedtime.     No current facility-administered medications for this visit.   Allergies  Allergen Reactions   Tomato Anaphylaxis and Shortness Of Breath     Review of  Systems: All systems reviewed and negative except where noted in HPI.   Lab Results  Component Value Date   IRON 9 (L) 07/12/2021   TIBC 442 07/12/2021   FERRITIN 6 (L) 07/12/2021    Lab Results  Component Value Date   CREATININE 1.43 (H) 07/12/2021   BUN 27 (H) 07/12/2021   NA 138 07/12/2021   K 3.7 07/12/2021   CL 107 07/12/2021   CO2 21 (L) 07/12/2021    Lab Results  Component Value Date   ALT 14 07/12/2021   AST 19 07/12/2021   ALKPHOS 85 07/12/2021   BILITOT 0.5 07/12/2021       Latest Ref Rng & Units 07/15/2021    1:11 AM 07/14/2021    8:29 AM 07/13/2021    6:44 AM  CBC  WBC 4.0 - 10.5 K/uL 7.5  6.1  6.7   Hemoglobin 13.0 - 17.0 g/dL 8.1  8.2  8.0   Hematocrit 39.0 - 52.0 % 28.0  28.2  28.0   Platelets 150 - 400 K/uL 491  468  465     Physical Exam: BP 110/70   Pulse 66   Ht _0  (1.854 m)   Wt 262 lb (118.8 kg)   BMI 34.57 kg/m  Constitutional: Pleasant,well-developed, male in no acute distress. Cardiovascular: Normal rate, regular rhythm.  Neurological: Alert and oriented to person place and time. Psychiatric: Normal mood and affect. Behavior is normal.   ASSESSMENT AND PLAN: 76 year old male here for reassessment of the following:  Symptomatic anemia Iron deficiency  Patient presented with symptomatic iron deficiency anemia earlier in the month.  Had some dark stools at baseline in the setting of oral iron supplementation.  EGD and colonoscopy as outlined.  Some duodenal AVMs ablated, then had colonoscopy with surprisingly and actively bleeding dieulafoy lesion.  Symptomatically doing much better since his admission.  He continues to take oral iron supplementation.  Using an occasional Goody powder, I cautioned him against this and recommend he avoid all NSAIDs until his anemia has resolved, and do not recommend routine use of NSAIDs regardless.  He recently had a CBC and iron studies from what he reports yesterday, will obtain results of that.  As long  as his anemia is improving with oral iron he should have his blood counts trended over time.  If he fails to respond to oral iron would need IV iron infusion.  I do not feel that he warrants additional endoscopic evaluation at this time unless his anemia persists or worsens despite iron supplementation. Clinically feels much better. Given no polyps on this exam I don't feel strongly he needs another colonoscopy given his age.   He agreed with the plan, we will await his lab results, he can call me in the interim with any questions or concerns.  Jolly Mango, MD [facility]

## 2021-08-31 ENCOUNTER — Telehealth: Payer: Self-pay | Admitting: Gastroenterology

## 2021-08-31 DIAGNOSIS — D509 Iron deficiency anemia, unspecified: Secondary | ICD-10-CM

## 2021-08-31 DIAGNOSIS — D649 Anemia, unspecified: Secondary | ICD-10-CM

## 2021-08-31 NOTE — Telephone Encounter (Signed)
Labs done 08/23/21 at the [facility]  Hgb 12.0, MCV 77, plt 335  Values at the [place] 5/10 were Hgb 9.6, and when he was hospitalized on 5/3 the Hgb was 6.0   Hgb is improving with iron, he should continue that and have follow up labs again in 2 months or so.   Allen Wiggins can you please let him know I got his labs, he should continue iron supplementation, and recommend repeat CBC with TIBC / ferritin testing in 2 months or so if you can help coordinate? Thanks

## 2021-08-31 NOTE — Telephone Encounter (Signed)
Called and spoke with patient regarding his lab results and Dr. Lanetta Inch recommendations. Pt has been advised to continue his iron supplement and we will repeat labs in 2 months. Pt is aware that he will receive a reminder call. Pt verbalized understanding and had no concerns at the end of the call.  Lab orders and reminder in epic.

## 2021-10-31 ENCOUNTER — Telehealth: Payer: Self-pay

## 2021-10-31 NOTE — Telephone Encounter (Signed)
Lm on vm for patient to return call 

## 2021-10-31 NOTE — Telephone Encounter (Signed)
-----   Message from Missy Sabins, RN sent at 08/31/2021  9:57 AM EDT ----- Regarding: Labs CBC/diff, IBC + Ferritin - orders are in epic

## 2021-11-01 NOTE — Telephone Encounter (Signed)
Lm on vm for patient to return call 

## 2021-11-03 NOTE — Telephone Encounter (Signed)
No return call received. Lab reminder mailed to patient.

## 2022-01-22 IMAGING — CT CT CERVICAL SPINE W/O CM
3 of 4 series · 13 of 33 positions shown, 16 images · non-contrast
Comparison: None.

CLINICAL DATA: Fell from a ladder. Head and cervical spine injury
suspected.

EXAM:
CT HEAD WITHOUT CONTRAST
CT MAXILLOFACIAL WITHOUT CONTRAST
CT CERVICAL SPINE WITHOUT CONTRAST
TECHNIQUE: Multidetector CT imaging of the head, cervical spine, and
maxillofacial structures were performed using the standard protocol
without intravenous contrast. Multiplanar CT image reconstructions
of the cervical spine and maxillofacial structures were also
generated.

[Series 8: orthogonal axials · axial · 0.21mm/px · z∈[+1053,+1159]mm · 5 of 96 slices shown, 7 images]
[im 16/96  soft-tissue]
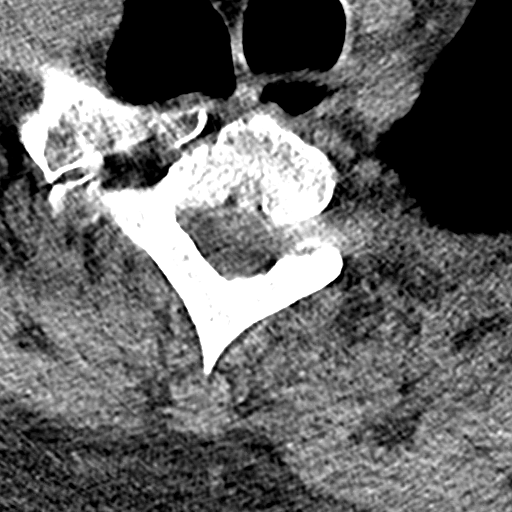
[im 16/96  bone]
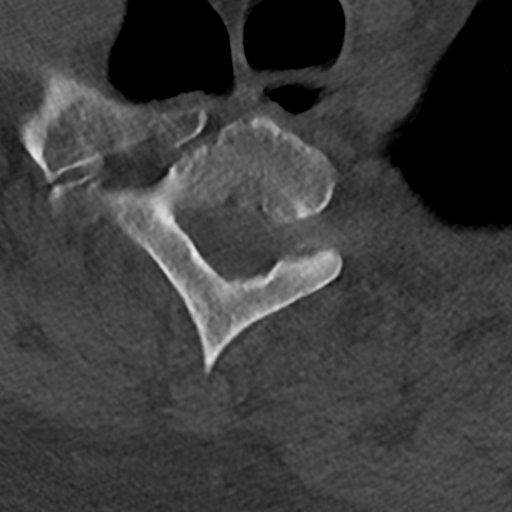
[im 32/96  bone]
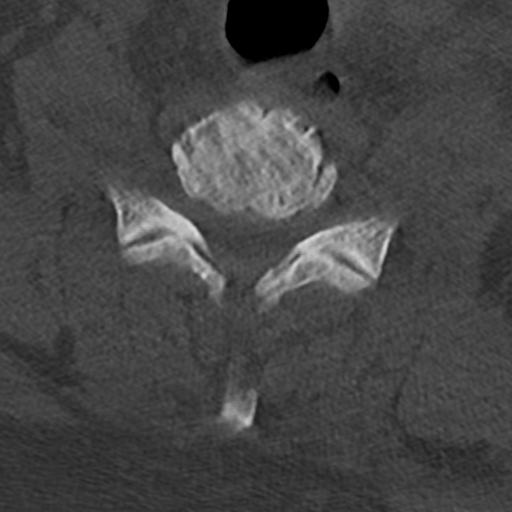
[im 48/96  bone]
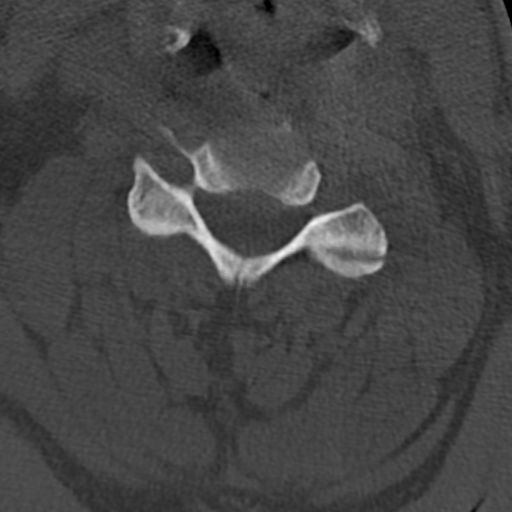
[im 64/96  bone]
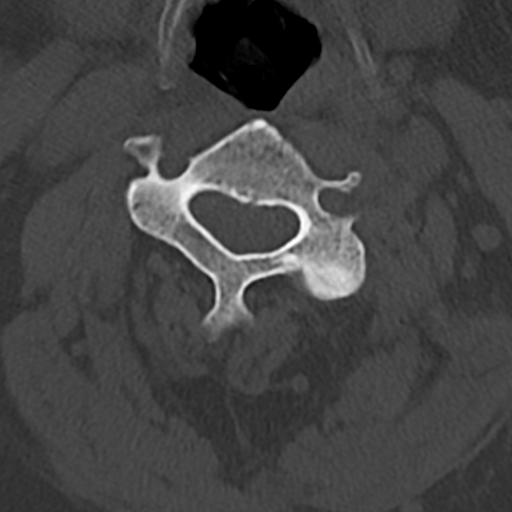
[im 80/96  soft-tissue]
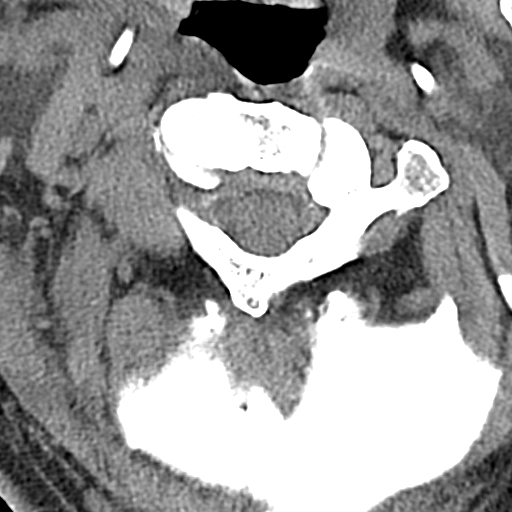
[im 80/96  bone]
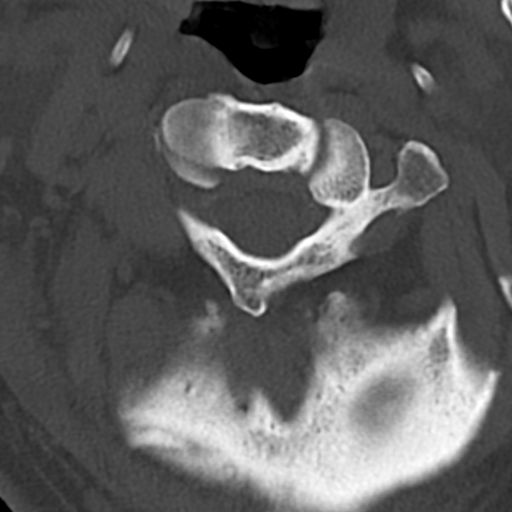

[Series 9: sag bone · sagittal · 0.35mm/px · 5 of 83 slices shown, 6 images]
[im 28/83  bone]
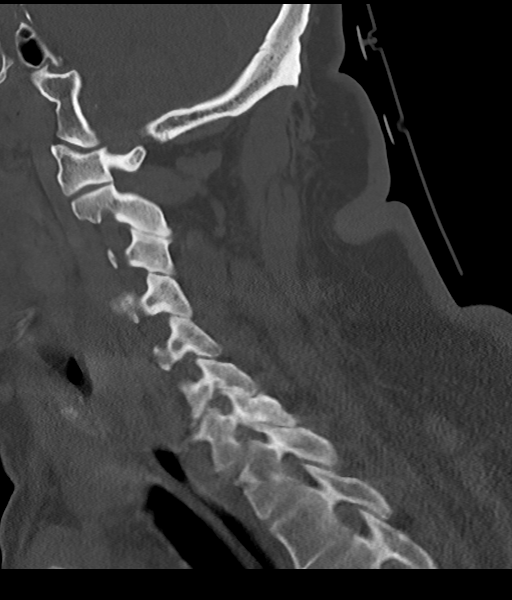
[im 35/83  bone]
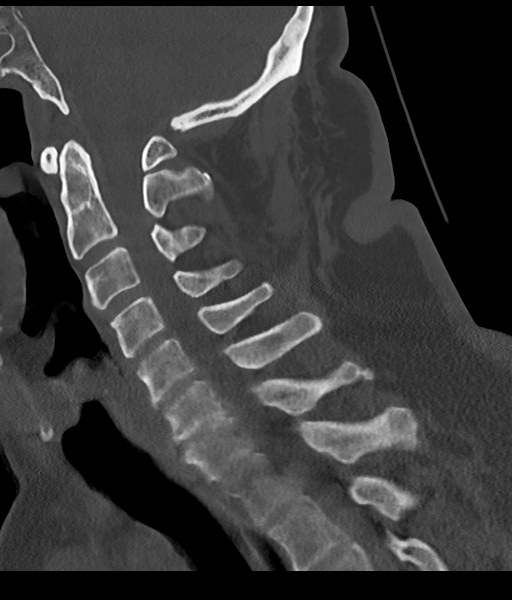
[im 42/83  soft-tissue]
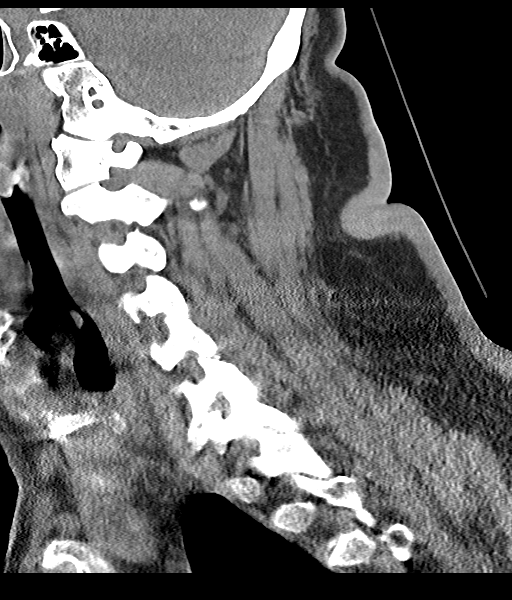
[im 42/83  bone]
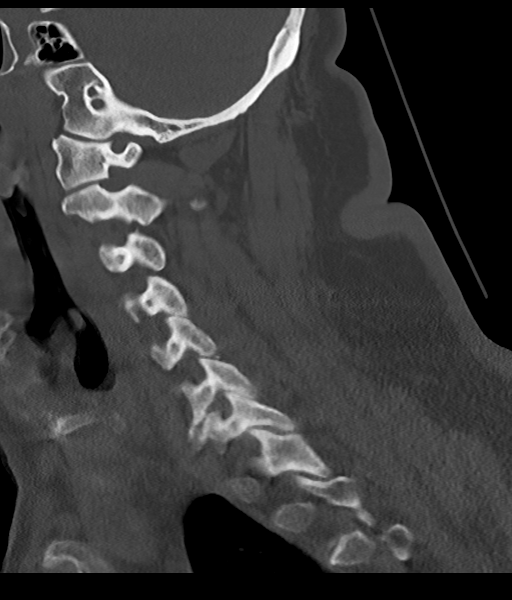
[im 48/83  bone]
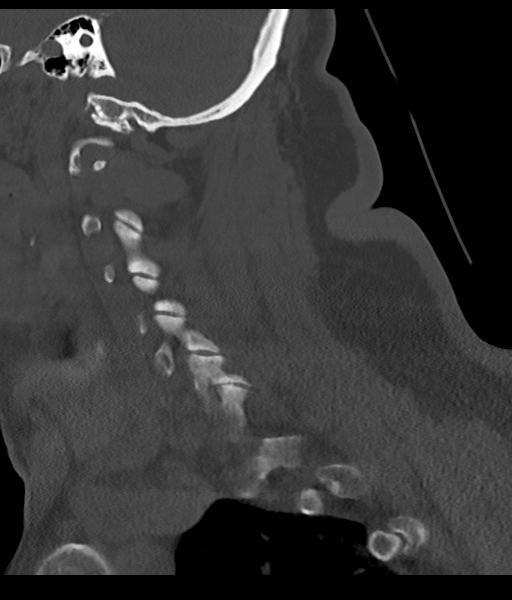
[im 55/83  bone]
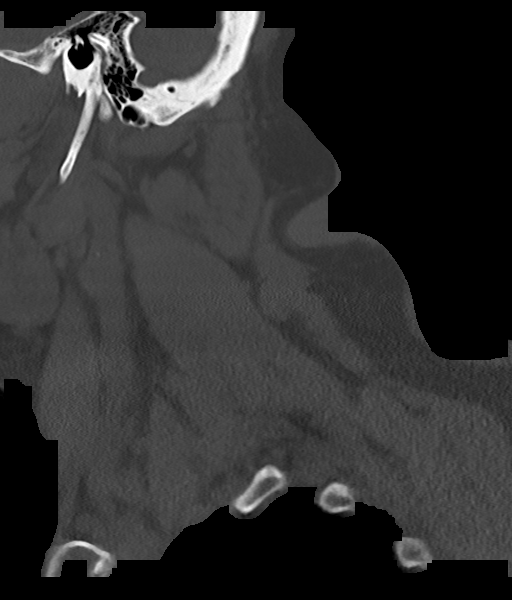

[Series 10: cor bone · coronal · 0.29mm/px · 3 of 70 slices shown]
[im 14/70  bone]
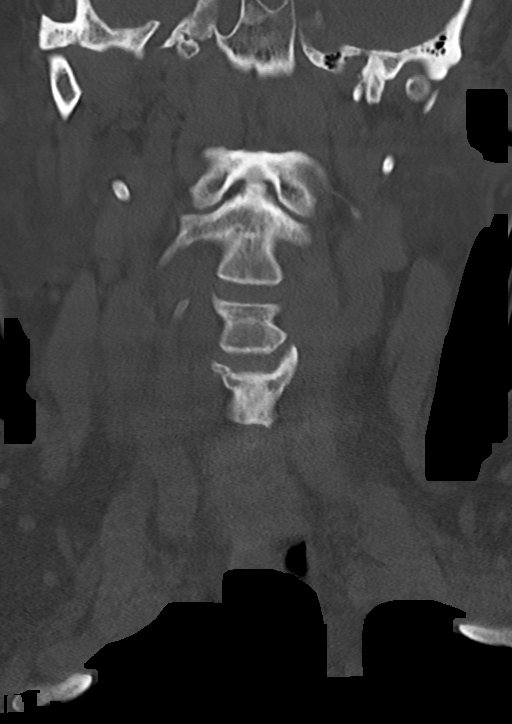
[im 28/70  bone]
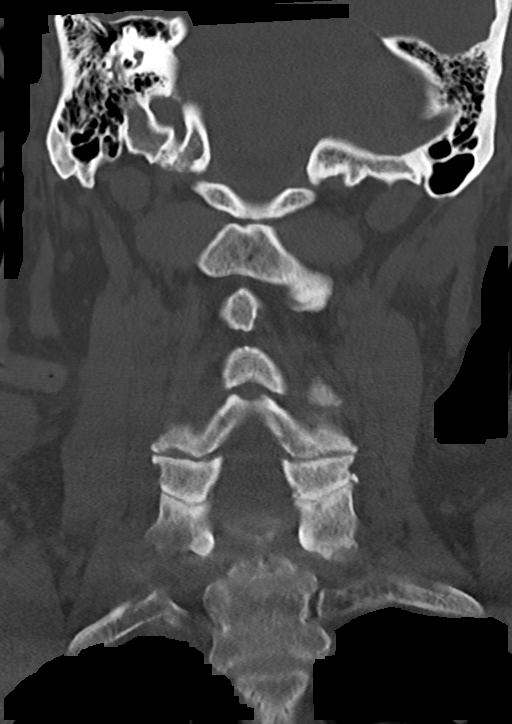
[im 42/70  bone]
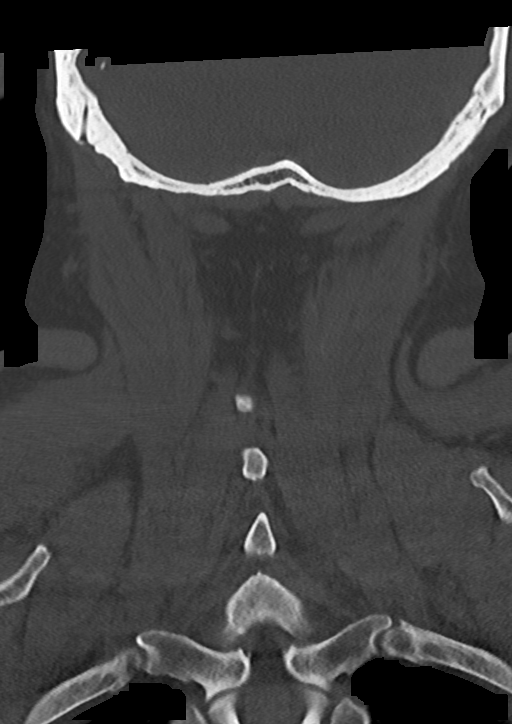

[13 of 33 positions shown; findings below may reference images not displayed]

FINDINGS: CT HEAD FINDINGS

Brain: Mildly enlarged ventricles and cortical sulci. No
intracranial hemorrhage, mass lesion or CT evidence of acute
infarction.

Vascular: No hyperdense vessel or unexpected calcification.

Skull: Normal. Negative for fracture or focal lesion.

Other: None.

CT MAXILLOFACIAL FINDINGS

Osseous: No fractures.

Orbits: Status post bilateral cataract extraction.

Sinuses: Bilateral sphenoid sinus air-fluid levels. Small amount of
mucosal thickening or retained secretions in the posterior right
maxillary sinus.

Soft tissues: Unremarkable.

CT CERVICAL SPINE FINDINGS

Alignment: Normal.

Skull base and vertebrae: No acute fracture. No primary bone lesion
or focal pathologic process.

Soft tissues and spinal canal: No prevertebral fluid or swelling. No
visible canal hematoma.

Disc levels:  Lower cervical spine degenerative changes.

Upper chest: Clear lung apices.

Other: Minimal bilateral carotid artery calcification.
IMPRESSION: 1. No skull fracture or intracranial hemorrhage.
2. No maxillofacial fracture.
3. No cervical spine fracture or subluxation.
4. Lower cervical spine degenerative changes.
5. Minimal bilateral carotid artery atheromatous calcification.
6. Bilateral acute sphenoid sinusitis.
